# Patient Record
Sex: Female | Born: 1940
Health system: Southern US, Community
[De-identification: ages and names within clinical notes are randomized; demographics above are authoritative.]

## PROBLEM LIST (undated history)

## (undated) DIAGNOSIS — M810 Age-related osteoporosis without current pathological fracture: Secondary | ICD-10-CM

## (undated) DIAGNOSIS — K219 Gastro-esophageal reflux disease without esophagitis: Secondary | ICD-10-CM

## (undated) DIAGNOSIS — E785 Hyperlipidemia, unspecified: Secondary | ICD-10-CM

## (undated) DIAGNOSIS — C801 Malignant (primary) neoplasm, unspecified: Secondary | ICD-10-CM

## (undated) DIAGNOSIS — J449 Chronic obstructive pulmonary disease, unspecified: Secondary | ICD-10-CM

## (undated) DIAGNOSIS — M1990 Unspecified osteoarthritis, unspecified site: Secondary | ICD-10-CM

## (undated) DIAGNOSIS — S22000A Wedge compression fracture of unspecified thoracic vertebra, initial encounter for closed fracture: Secondary | ICD-10-CM

## (undated) DIAGNOSIS — E039 Hypothyroidism, unspecified: Secondary | ICD-10-CM

## (undated) DIAGNOSIS — I10 Essential (primary) hypertension: Secondary | ICD-10-CM

## (undated) HISTORY — DX: Chronic obstructive pulmonary disease, unspecified: J44.9

## (undated) HISTORY — PX: BACK SURGERY: SHX140

## (undated) HISTORY — DX: Wedge compression fracture of unspecified thoracic vertebra, initial encounter for closed fracture: S22.000A

## (undated) HISTORY — DX: Age-related osteoporosis without current pathological fracture: M81.0

## (undated) HISTORY — PX: COLONOSCOPY: SHX174

## (undated) HISTORY — PX: EYE SURGERY: SHX253

---

## 2005-04-14 ENCOUNTER — Ambulatory Visit: Payer: Self-pay | Admitting: Cardiology

## 2005-05-06 ENCOUNTER — Ambulatory Visit: Payer: Self-pay | Admitting: Cardiology

## 2011-08-31 ENCOUNTER — Encounter (HOSPITAL_COMMUNITY): Payer: Self-pay | Admitting: Pharmacy Technician

## 2011-09-02 ENCOUNTER — Encounter (HOSPITAL_COMMUNITY)
Admission: RE | Admit: 2011-09-02 | Discharge: 2011-09-02 | Disposition: A | Discharge status: Home / Self Care | Payer: Medicare Other | Source: Ambulatory Visit | Attending: Neurosurgery | Admitting: Neurosurgery

## 2011-09-02 ENCOUNTER — Encounter (HOSPITAL_COMMUNITY)
Admission: RE | Admit: 2011-09-02 | Discharge: 2011-09-02 | Disposition: A | Discharge status: Home / Self Care | Payer: Medicare Other | Source: Ambulatory Visit | Attending: Anesthesiology | Admitting: Anesthesiology

## 2011-09-02 ENCOUNTER — Encounter (HOSPITAL_COMMUNITY): Payer: Self-pay

## 2011-09-02 DIAGNOSIS — J4489 Other specified chronic obstructive pulmonary disease: Secondary | ICD-10-CM | POA: Insufficient documentation

## 2011-09-02 DIAGNOSIS — R05 Cough: Secondary | ICD-10-CM | POA: Insufficient documentation

## 2011-09-02 DIAGNOSIS — Z01818 Encounter for other preprocedural examination: Secondary | ICD-10-CM | POA: Insufficient documentation

## 2011-09-02 DIAGNOSIS — R059 Cough, unspecified: Secondary | ICD-10-CM | POA: Insufficient documentation

## 2011-09-02 DIAGNOSIS — R0602 Shortness of breath: Secondary | ICD-10-CM | POA: Insufficient documentation

## 2011-09-02 DIAGNOSIS — F17200 Nicotine dependence, unspecified, uncomplicated: Secondary | ICD-10-CM | POA: Insufficient documentation

## 2011-09-02 DIAGNOSIS — J449 Chronic obstructive pulmonary disease, unspecified: Secondary | ICD-10-CM | POA: Insufficient documentation

## 2011-09-02 DIAGNOSIS — M545 Low back pain, unspecified: Secondary | ICD-10-CM | POA: Insufficient documentation

## 2011-09-02 HISTORY — DX: Hyperlipidemia, unspecified: E78.5

## 2011-09-02 HISTORY — DX: Unspecified osteoarthritis, unspecified site: M19.90

## 2011-09-02 LAB — TYPE AND SCREEN
ABO/RH(D): A POS
Antibody Screen: NEGATIVE

## 2011-09-02 LAB — DIFFERENTIAL
Basophils Absolute: 0.1 10*3/uL (ref 0.0–0.1)
Lymphocytes Relative: 36 % (ref 12–46)
Lymphs Abs: 3.7 10*3/uL (ref 0.7–4.0)
Neutro Abs: 5 10*3/uL (ref 1.7–7.7)

## 2011-09-02 LAB — SURGICAL PCR SCREEN
MRSA, PCR: NEGATIVE
Staphylococcus aureus: NEGATIVE

## 2011-09-02 LAB — CBC
Platelets: 291 10*3/uL (ref 150–400)
RBC: 4.93 MIL/uL (ref 3.87–5.11)
RDW: 12.6 % (ref 11.5–15.5)
WBC: 10.4 10*3/uL (ref 4.0–10.5)

## 2011-09-02 MED ORDER — CEFAZOLIN SODIUM 1-5 GM-% IV SOLN: 1.0000 g | INTRAVENOUS | Status: DC

## 2011-09-02 MED ORDER — DEXAMETHASONE SODIUM PHOSPHATE 10 MG/ML IJ SOLN: 10.0000 mg | Freq: Once | INTRAMUSCULAR | Status: DC

## 2011-09-02 NOTE — Pre-Procedure Instructions (Signed)
20 Tonya Murphy  09/02/2011   Your procedure is scheduled on:  09/14/2011  Report to [facility] at 0530 AM.  Call this number if you have problems the morning of surgery: [phone]   Remember:   Do not eat food:After Midnight.  May have clear liquids: up to 4 Hours before arrival.  Clear liquids include soda, tea, black coffee, apple or grape juice, broth.  Take these medicines the morning of surgery with A SIP OF WATER: OMEPRAZOLE   Do not wear jewelry, make-up or nail polish.  Do not wear lotions, powders, or perfumes. You may wear deodorant.  Do not shave 48 hours prior to surgery.  Do not bring valuables to the hospital.  Contacts, dentures or bridgework may not be worn into surgery.  Leave suitcase in the car. After surgery it may be brought to your room.  For patients admitted to the hospital, checkout time is 11:00 AM the day of discharge.   Patients discharged the day of surgery will not be allowed to drive home.  Name and phone number of your driver: family  Special Instructions: CHG Shower Use Special Wash: 1/2 bottle night before surgery and 1/2 bottle morning of surgery.   Please read over the following fact sheets that you were given: Pain Booklet, Coughing and Deep Breathing, Blood Transfusion Information, MRSA Information and Surgical Site Infection Prevention

## 2011-09-14 ENCOUNTER — Encounter (HOSPITAL_COMMUNITY): Admission: RE | Payer: Self-pay | Source: Ambulatory Visit

## 2011-09-14 ENCOUNTER — Inpatient Hospital Stay (HOSPITAL_COMMUNITY): Admission: RE | Admit: 2011-09-14 | Payer: Medicare Other | Source: Ambulatory Visit | Admitting: Neurosurgery

## 2011-09-14 SURGERY — LUMBAR LAMINECTOMY/DECOMPRESSION MICRODISCECTOMY
Anesthesia: General | Site: Back | Laterality: Left

## 2015-12-30 DIAGNOSIS — Z1231 Encounter for screening mammogram for malignant neoplasm of breast: Secondary | ICD-10-CM | POA: Diagnosis not present

## 2016-02-26 DIAGNOSIS — R194 Change in bowel habit: Secondary | ICD-10-CM | POA: Diagnosis not present

## 2016-02-28 DIAGNOSIS — Z8249 Family history of ischemic heart disease and other diseases of the circulatory system: Secondary | ICD-10-CM | POA: Diagnosis not present

## 2016-02-28 DIAGNOSIS — Z79899 Other long term (current) drug therapy: Secondary | ICD-10-CM | POA: Diagnosis not present

## 2016-02-28 DIAGNOSIS — Z7982 Long term (current) use of aspirin: Secondary | ICD-10-CM | POA: Diagnosis not present

## 2016-02-28 DIAGNOSIS — J449 Chronic obstructive pulmonary disease, unspecified: Secondary | ICD-10-CM | POA: Diagnosis not present

## 2016-02-28 DIAGNOSIS — R197 Diarrhea, unspecified: Secondary | ICD-10-CM | POA: Diagnosis not present

## 2016-02-28 DIAGNOSIS — E785 Hyperlipidemia, unspecified: Secondary | ICD-10-CM | POA: Diagnosis not present

## 2016-02-28 DIAGNOSIS — F17210 Nicotine dependence, cigarettes, uncomplicated: Secondary | ICD-10-CM | POA: Diagnosis not present

## 2016-02-28 DIAGNOSIS — R194 Change in bowel habit: Secondary | ICD-10-CM | POA: Diagnosis not present

## 2016-02-28 DIAGNOSIS — R109 Unspecified abdominal pain: Secondary | ICD-10-CM | POA: Diagnosis not present

## 2016-02-28 DIAGNOSIS — Z823 Family history of stroke: Secondary | ICD-10-CM | POA: Diagnosis not present

## 2016-04-17 DIAGNOSIS — I700 Atherosclerosis of aorta: Secondary | ICD-10-CM | POA: Diagnosis not present

## 2016-04-17 DIAGNOSIS — M549 Dorsalgia, unspecified: Secondary | ICD-10-CM | POA: Diagnosis not present

## 2016-04-17 DIAGNOSIS — M47814 Spondylosis without myelopathy or radiculopathy, thoracic region: Secondary | ICD-10-CM | POA: Diagnosis not present

## 2016-04-17 DIAGNOSIS — M546 Pain in thoracic spine: Secondary | ICD-10-CM | POA: Diagnosis not present

## 2016-04-17 DIAGNOSIS — Z299 Encounter for prophylactic measures, unspecified: Secondary | ICD-10-CM | POA: Diagnosis not present

## 2016-04-17 DIAGNOSIS — F17200 Nicotine dependence, unspecified, uncomplicated: Secondary | ICD-10-CM | POA: Diagnosis not present

## 2016-04-17 DIAGNOSIS — M2560 Stiffness of unspecified joint, not elsewhere classified: Secondary | ICD-10-CM | POA: Diagnosis not present

## 2016-04-23 DIAGNOSIS — R0602 Shortness of breath: Secondary | ICD-10-CM | POA: Diagnosis not present

## 2016-04-23 DIAGNOSIS — F17210 Nicotine dependence, cigarettes, uncomplicated: Secondary | ICD-10-CM | POA: Diagnosis not present

## 2016-04-23 DIAGNOSIS — J209 Acute bronchitis, unspecified: Secondary | ICD-10-CM | POA: Diagnosis not present

## 2016-04-23 DIAGNOSIS — I10 Essential (primary) hypertension: Secondary | ICD-10-CM | POA: Diagnosis not present

## 2016-04-23 DIAGNOSIS — M546 Pain in thoracic spine: Secondary | ICD-10-CM | POA: Diagnosis not present

## 2016-04-23 DIAGNOSIS — Z720 Tobacco use: Secondary | ICD-10-CM | POA: Diagnosis not present

## 2016-04-24 DIAGNOSIS — J440 Chronic obstructive pulmonary disease with acute lower respiratory infection: Secondary | ICD-10-CM | POA: Diagnosis not present

## 2016-04-24 DIAGNOSIS — Z883 Allergy status to other anti-infective agents status: Secondary | ICD-10-CM | POA: Diagnosis not present

## 2016-04-24 DIAGNOSIS — E782 Mixed hyperlipidemia: Secondary | ICD-10-CM | POA: Diagnosis present

## 2016-04-24 DIAGNOSIS — I10 Essential (primary) hypertension: Secondary | ICD-10-CM | POA: Diagnosis present

## 2016-04-24 DIAGNOSIS — Z9109 Other allergy status, other than to drugs and biological substances: Secondary | ICD-10-CM | POA: Diagnosis not present

## 2016-04-24 DIAGNOSIS — M8088XA Other osteoporosis with current pathological fracture, vertebra(e), initial encounter for fracture: Secondary | ICD-10-CM | POA: Diagnosis not present

## 2016-04-24 DIAGNOSIS — J40 Bronchitis, not specified as acute or chronic: Secondary | ICD-10-CM | POA: Diagnosis not present

## 2016-04-24 DIAGNOSIS — D72829 Elevated white blood cell count, unspecified: Secondary | ICD-10-CM | POA: Diagnosis present

## 2016-04-24 DIAGNOSIS — J302 Other seasonal allergic rhinitis: Secondary | ICD-10-CM | POA: Diagnosis present

## 2016-04-24 DIAGNOSIS — J449 Chronic obstructive pulmonary disease, unspecified: Secondary | ICD-10-CM | POA: Diagnosis present

## 2016-04-24 DIAGNOSIS — M546 Pain in thoracic spine: Secondary | ICD-10-CM | POA: Diagnosis not present

## 2016-04-24 DIAGNOSIS — J181 Lobar pneumonia, unspecified organism: Secondary | ICD-10-CM | POA: Diagnosis not present

## 2016-04-24 DIAGNOSIS — J42 Unspecified chronic bronchitis: Secondary | ICD-10-CM | POA: Diagnosis present

## 2016-04-24 DIAGNOSIS — S22060A Wedge compression fracture of T7-T8 vertebra, initial encounter for closed fracture: Secondary | ICD-10-CM | POA: Diagnosis not present

## 2016-04-28 DIAGNOSIS — M4854XA Collapsed vertebra, not elsewhere classified, thoracic region, initial encounter for fracture: Secondary | ICD-10-CM | POA: Diagnosis not present

## 2016-04-28 DIAGNOSIS — J449 Chronic obstructive pulmonary disease, unspecified: Secondary | ICD-10-CM | POA: Diagnosis not present

## 2016-04-28 DIAGNOSIS — E785 Hyperlipidemia, unspecified: Secondary | ICD-10-CM | POA: Diagnosis not present

## 2016-04-28 DIAGNOSIS — S32000A Wedge compression fracture of unspecified lumbar vertebra, initial encounter for closed fracture: Secondary | ICD-10-CM | POA: Diagnosis not present

## 2016-04-28 DIAGNOSIS — G43909 Migraine, unspecified, not intractable, without status migrainosus: Secondary | ICD-10-CM | POA: Diagnosis not present

## 2016-04-28 DIAGNOSIS — I10 Essential (primary) hypertension: Secondary | ICD-10-CM | POA: Diagnosis not present

## 2016-04-28 DIAGNOSIS — M8588 Other specified disorders of bone density and structure, other site: Secondary | ICD-10-CM | POA: Diagnosis not present

## 2016-05-07 DIAGNOSIS — M8468XS Pathological fracture in other disease, other site, sequela: Secondary | ICD-10-CM | POA: Diagnosis not present

## 2016-05-07 DIAGNOSIS — Z9889 Other specified postprocedural states: Secondary | ICD-10-CM | POA: Diagnosis not present

## 2016-05-27 DIAGNOSIS — I700 Atherosclerosis of aorta: Secondary | ICD-10-CM | POA: Diagnosis not present

## 2016-05-27 DIAGNOSIS — S22050A Wedge compression fracture of T5-T6 vertebra, initial encounter for closed fracture: Secondary | ICD-10-CM | POA: Diagnosis not present

## 2016-05-27 DIAGNOSIS — M4854XA Collapsed vertebra, not elsewhere classified, thoracic region, initial encounter for fracture: Secondary | ICD-10-CM | POA: Diagnosis not present

## 2016-05-27 DIAGNOSIS — M8588 Other specified disorders of bone density and structure, other site: Secondary | ICD-10-CM | POA: Diagnosis not present

## 2016-06-01 DIAGNOSIS — M4854XA Collapsed vertebra, not elsewhere classified, thoracic region, initial encounter for fracture: Secondary | ICD-10-CM | POA: Diagnosis not present

## 2016-06-01 DIAGNOSIS — M546 Pain in thoracic spine: Secondary | ICD-10-CM | POA: Diagnosis not present

## 2016-06-01 DIAGNOSIS — M4854XD Collapsed vertebra, not elsewhere classified, thoracic region, subsequent encounter for fracture with routine healing: Secondary | ICD-10-CM | POA: Diagnosis not present

## 2016-06-03 DIAGNOSIS — M810 Age-related osteoporosis without current pathological fracture: Secondary | ICD-10-CM | POA: Diagnosis not present

## 2016-06-03 DIAGNOSIS — Z823 Family history of stroke: Secondary | ICD-10-CM | POA: Diagnosis not present

## 2016-06-03 DIAGNOSIS — M545 Low back pain: Secondary | ICD-10-CM | POA: Diagnosis not present

## 2016-06-03 DIAGNOSIS — M069 Rheumatoid arthritis, unspecified: Secondary | ICD-10-CM | POA: Diagnosis not present

## 2016-06-03 DIAGNOSIS — M1990 Unspecified osteoarthritis, unspecified site: Secondary | ICD-10-CM | POA: Diagnosis not present

## 2016-06-03 DIAGNOSIS — M546 Pain in thoracic spine: Secondary | ICD-10-CM | POA: Diagnosis not present

## 2016-06-03 DIAGNOSIS — Z79899 Other long term (current) drug therapy: Secondary | ICD-10-CM | POA: Diagnosis not present

## 2016-06-03 DIAGNOSIS — Z791 Long term (current) use of non-steroidal anti-inflammatories (NSAID): Secondary | ICD-10-CM | POA: Diagnosis not present

## 2016-06-03 DIAGNOSIS — E785 Hyperlipidemia, unspecified: Secondary | ICD-10-CM | POA: Diagnosis not present

## 2016-06-03 DIAGNOSIS — E7800 Pure hypercholesterolemia, unspecified: Secondary | ICD-10-CM | POA: Diagnosis not present

## 2016-06-03 DIAGNOSIS — J449 Chronic obstructive pulmonary disease, unspecified: Secondary | ICD-10-CM | POA: Diagnosis not present

## 2016-06-03 DIAGNOSIS — M4854XA Collapsed vertebra, not elsewhere classified, thoracic region, initial encounter for fracture: Secondary | ICD-10-CM | POA: Diagnosis not present

## 2016-06-03 DIAGNOSIS — F17210 Nicotine dependence, cigarettes, uncomplicated: Secondary | ICD-10-CM | POA: Diagnosis not present

## 2016-06-03 DIAGNOSIS — Z79891 Long term (current) use of opiate analgesic: Secondary | ICD-10-CM | POA: Diagnosis not present

## 2016-06-03 DIAGNOSIS — Z7982 Long term (current) use of aspirin: Secondary | ICD-10-CM | POA: Diagnosis not present

## 2016-06-12 DIAGNOSIS — M4184 Other forms of scoliosis, thoracic region: Secondary | ICD-10-CM | POA: Diagnosis not present

## 2016-06-12 DIAGNOSIS — M4854XA Collapsed vertebra, not elsewhere classified, thoracic region, initial encounter for fracture: Secondary | ICD-10-CM | POA: Diagnosis not present

## 2016-06-12 DIAGNOSIS — Z9889 Other specified postprocedural states: Secondary | ICD-10-CM | POA: Diagnosis not present

## 2016-07-02 DIAGNOSIS — Z23 Encounter for immunization: Secondary | ICD-10-CM | POA: Diagnosis not present

## 2016-07-30 DIAGNOSIS — Z6825 Body mass index (BMI) 25.0-25.9, adult: Secondary | ICD-10-CM | POA: Diagnosis not present

## 2016-07-30 DIAGNOSIS — Z1389 Encounter for screening for other disorder: Secondary | ICD-10-CM | POA: Diagnosis not present

## 2016-07-30 DIAGNOSIS — Z299 Encounter for prophylactic measures, unspecified: Secondary | ICD-10-CM | POA: Diagnosis not present

## 2016-07-30 DIAGNOSIS — Z1211 Encounter for screening for malignant neoplasm of colon: Secondary | ICD-10-CM | POA: Diagnosis not present

## 2016-07-30 DIAGNOSIS — Z0000 Encounter for general adult medical examination without abnormal findings: Secondary | ICD-10-CM | POA: Diagnosis not present

## 2016-07-30 DIAGNOSIS — Z7189 Other specified counseling: Secondary | ICD-10-CM | POA: Diagnosis not present

## 2016-07-31 DIAGNOSIS — R5383 Other fatigue: Secondary | ICD-10-CM | POA: Diagnosis not present

## 2016-07-31 DIAGNOSIS — Z79899 Other long term (current) drug therapy: Secondary | ICD-10-CM | POA: Diagnosis not present

## 2016-07-31 DIAGNOSIS — I10 Essential (primary) hypertension: Secondary | ICD-10-CM | POA: Diagnosis not present

## 2016-08-31 DIAGNOSIS — L02414 Cutaneous abscess of left upper limb: Secondary | ICD-10-CM | POA: Diagnosis not present

## 2016-12-31 DIAGNOSIS — Z1231 Encounter for screening mammogram for malignant neoplasm of breast: Secondary | ICD-10-CM | POA: Diagnosis not present

## 2017-05-24 DIAGNOSIS — E663 Overweight: Secondary | ICD-10-CM | POA: Diagnosis not present

## 2017-05-24 DIAGNOSIS — F17210 Nicotine dependence, cigarettes, uncomplicated: Secondary | ICD-10-CM | POA: Diagnosis not present

## 2017-05-24 DIAGNOSIS — J42 Unspecified chronic bronchitis: Secondary | ICD-10-CM | POA: Diagnosis not present

## 2017-05-24 DIAGNOSIS — J449 Chronic obstructive pulmonary disease, unspecified: Secondary | ICD-10-CM | POA: Diagnosis not present

## 2017-05-24 DIAGNOSIS — Z299 Encounter for prophylactic measures, unspecified: Secondary | ICD-10-CM | POA: Diagnosis not present

## 2017-05-24 DIAGNOSIS — E782 Mixed hyperlipidemia: Secondary | ICD-10-CM | POA: Diagnosis not present

## 2017-05-24 DIAGNOSIS — R197 Diarrhea, unspecified: Secondary | ICD-10-CM | POA: Diagnosis not present

## 2017-07-09 DIAGNOSIS — Z23 Encounter for immunization: Secondary | ICD-10-CM | POA: Diagnosis not present

## 2017-07-12 DIAGNOSIS — R197 Diarrhea, unspecified: Secondary | ICD-10-CM | POA: Diagnosis not present

## 2017-07-16 DIAGNOSIS — R197 Diarrhea, unspecified: Secondary | ICD-10-CM | POA: Diagnosis not present

## 2017-08-04 DIAGNOSIS — R5383 Other fatigue: Secondary | ICD-10-CM | POA: Diagnosis not present

## 2017-08-04 DIAGNOSIS — Z1211 Encounter for screening for malignant neoplasm of colon: Secondary | ICD-10-CM | POA: Diagnosis not present

## 2017-08-04 DIAGNOSIS — E782 Mixed hyperlipidemia: Secondary | ICD-10-CM | POA: Diagnosis not present

## 2017-08-04 DIAGNOSIS — Z01419 Encounter for gynecological examination (general) (routine) without abnormal findings: Secondary | ICD-10-CM | POA: Diagnosis not present

## 2017-08-04 DIAGNOSIS — F419 Anxiety disorder, unspecified: Secondary | ICD-10-CM | POA: Diagnosis not present

## 2017-08-04 DIAGNOSIS — Z299 Encounter for prophylactic measures, unspecified: Secondary | ICD-10-CM | POA: Diagnosis not present

## 2017-08-04 DIAGNOSIS — Z1339 Encounter for screening examination for other mental health and behavioral disorders: Secondary | ICD-10-CM | POA: Diagnosis not present

## 2017-08-04 DIAGNOSIS — Z0000 Encounter for general adult medical examination without abnormal findings: Secondary | ICD-10-CM | POA: Diagnosis not present

## 2017-08-04 DIAGNOSIS — J449 Chronic obstructive pulmonary disease, unspecified: Secondary | ICD-10-CM | POA: Diagnosis not present

## 2017-08-04 DIAGNOSIS — E663 Overweight: Secondary | ICD-10-CM | POA: Diagnosis not present

## 2017-08-04 DIAGNOSIS — Z7189 Other specified counseling: Secondary | ICD-10-CM | POA: Diagnosis not present

## 2017-08-04 DIAGNOSIS — Z1331 Encounter for screening for depression: Secondary | ICD-10-CM | POA: Diagnosis not present

## 2017-08-05 DIAGNOSIS — Z79899 Other long term (current) drug therapy: Secondary | ICD-10-CM | POA: Diagnosis not present

## 2017-08-05 DIAGNOSIS — F419 Anxiety disorder, unspecified: Secondary | ICD-10-CM | POA: Diagnosis not present

## 2017-08-05 DIAGNOSIS — E782 Mixed hyperlipidemia: Secondary | ICD-10-CM | POA: Diagnosis not present

## 2017-08-09 DIAGNOSIS — Z299 Encounter for prophylactic measures, unspecified: Secondary | ICD-10-CM | POA: Diagnosis not present

## 2017-08-09 DIAGNOSIS — D72829 Elevated white blood cell count, unspecified: Secondary | ICD-10-CM | POA: Diagnosis not present

## 2017-08-09 DIAGNOSIS — Z6825 Body mass index (BMI) 25.0-25.9, adult: Secondary | ICD-10-CM | POA: Diagnosis not present

## 2017-08-09 DIAGNOSIS — J441 Chronic obstructive pulmonary disease with (acute) exacerbation: Secondary | ICD-10-CM | POA: Diagnosis not present

## 2017-08-09 DIAGNOSIS — I10 Essential (primary) hypertension: Secondary | ICD-10-CM | POA: Diagnosis not present

## 2017-08-09 DIAGNOSIS — F17210 Nicotine dependence, cigarettes, uncomplicated: Secondary | ICD-10-CM | POA: Diagnosis not present

## 2017-08-11 DIAGNOSIS — Z1283 Encounter for screening for malignant neoplasm of skin: Secondary | ICD-10-CM | POA: Diagnosis not present

## 2017-08-11 DIAGNOSIS — D225 Melanocytic nevi of trunk: Secondary | ICD-10-CM | POA: Diagnosis not present

## 2017-08-11 DIAGNOSIS — C44722 Squamous cell carcinoma of skin of right lower limb, including hip: Secondary | ICD-10-CM | POA: Diagnosis not present

## 2017-08-24 DIAGNOSIS — E782 Mixed hyperlipidemia: Secondary | ICD-10-CM | POA: Diagnosis not present

## 2017-08-24 DIAGNOSIS — I10 Essential (primary) hypertension: Secondary | ICD-10-CM | POA: Diagnosis not present

## 2017-08-24 DIAGNOSIS — Z299 Encounter for prophylactic measures, unspecified: Secondary | ICD-10-CM | POA: Diagnosis not present

## 2017-08-24 DIAGNOSIS — J449 Chronic obstructive pulmonary disease, unspecified: Secondary | ICD-10-CM | POA: Diagnosis not present

## 2017-08-24 DIAGNOSIS — I2510 Atherosclerotic heart disease of native coronary artery without angina pectoris: Secondary | ICD-10-CM | POA: Diagnosis not present

## 2017-08-24 DIAGNOSIS — F329 Major depressive disorder, single episode, unspecified: Secondary | ICD-10-CM | POA: Diagnosis not present

## 2017-08-24 DIAGNOSIS — F17200 Nicotine dependence, unspecified, uncomplicated: Secondary | ICD-10-CM | POA: Diagnosis not present

## 2017-08-24 DIAGNOSIS — D72829 Elevated white blood cell count, unspecified: Secondary | ICD-10-CM | POA: Diagnosis not present

## 2017-08-24 DIAGNOSIS — Z6824 Body mass index (BMI) 24.0-24.9, adult: Secondary | ICD-10-CM | POA: Diagnosis not present

## 2017-09-22 DIAGNOSIS — D72829 Elevated white blood cell count, unspecified: Secondary | ICD-10-CM | POA: Diagnosis not present

## 2017-09-22 DIAGNOSIS — Z85828 Personal history of other malignant neoplasm of skin: Secondary | ICD-10-CM | POA: Diagnosis not present

## 2017-09-22 DIAGNOSIS — L821 Other seborrheic keratosis: Secondary | ICD-10-CM | POA: Diagnosis not present

## 2017-09-22 DIAGNOSIS — Z08 Encounter for follow-up examination after completed treatment for malignant neoplasm: Secondary | ICD-10-CM | POA: Diagnosis not present

## 2017-10-05 HISTORY — PX: BREAST BIOPSY: SHX20

## 2018-01-20 DIAGNOSIS — R928 Other abnormal and inconclusive findings on diagnostic imaging of breast: Secondary | ICD-10-CM | POA: Diagnosis not present

## 2018-01-20 DIAGNOSIS — Z1231 Encounter for screening mammogram for malignant neoplasm of breast: Secondary | ICD-10-CM | POA: Diagnosis not present

## 2018-01-26 DIAGNOSIS — N6321 Unspecified lump in the left breast, upper outer quadrant: Secondary | ICD-10-CM | POA: Diagnosis not present

## 2018-01-26 DIAGNOSIS — R928 Other abnormal and inconclusive findings on diagnostic imaging of breast: Secondary | ICD-10-CM | POA: Diagnosis not present

## 2018-01-31 DIAGNOSIS — M546 Pain in thoracic spine: Secondary | ICD-10-CM | POA: Insufficient documentation

## 2018-01-31 DIAGNOSIS — M4854XA Collapsed vertebra, not elsewhere classified, thoracic region, initial encounter for fracture: Secondary | ICD-10-CM | POA: Diagnosis not present

## 2018-02-01 DIAGNOSIS — S22070A Wedge compression fracture of T9-T10 vertebra, initial encounter for closed fracture: Secondary | ICD-10-CM | POA: Insufficient documentation

## 2018-02-02 DIAGNOSIS — N6321 Unspecified lump in the left breast, upper outer quadrant: Secondary | ICD-10-CM | POA: Diagnosis not present

## 2018-02-02 DIAGNOSIS — R928 Other abnormal and inconclusive findings on diagnostic imaging of breast: Secondary | ICD-10-CM | POA: Diagnosis not present

## 2018-02-02 DIAGNOSIS — N6012 Diffuse cystic mastopathy of left breast: Secondary | ICD-10-CM | POA: Diagnosis not present

## 2018-02-08 DIAGNOSIS — S22070A Wedge compression fracture of T9-T10 vertebra, initial encounter for closed fracture: Secondary | ICD-10-CM | POA: Diagnosis not present

## 2018-02-08 DIAGNOSIS — M546 Pain in thoracic spine: Secondary | ICD-10-CM | POA: Diagnosis not present

## 2018-02-08 DIAGNOSIS — Z981 Arthrodesis status: Secondary | ICD-10-CM | POA: Diagnosis not present

## 2018-02-10 DIAGNOSIS — Z888 Allergy status to other drugs, medicaments and biological substances status: Secondary | ICD-10-CM | POA: Diagnosis not present

## 2018-02-10 DIAGNOSIS — K589 Irritable bowel syndrome without diarrhea: Secondary | ICD-10-CM | POA: Diagnosis not present

## 2018-02-10 DIAGNOSIS — S22070A Wedge compression fracture of T9-T10 vertebra, initial encounter for closed fracture: Secondary | ICD-10-CM | POA: Diagnosis not present

## 2018-02-10 DIAGNOSIS — M810 Age-related osteoporosis without current pathological fracture: Secondary | ICD-10-CM | POA: Diagnosis not present

## 2018-02-10 DIAGNOSIS — Z823 Family history of stroke: Secondary | ICD-10-CM | POA: Diagnosis not present

## 2018-02-10 DIAGNOSIS — M546 Pain in thoracic spine: Secondary | ICD-10-CM | POA: Diagnosis not present

## 2018-02-10 DIAGNOSIS — M1990 Unspecified osteoarthritis, unspecified site: Secondary | ICD-10-CM | POA: Diagnosis not present

## 2018-02-10 DIAGNOSIS — Z7982 Long term (current) use of aspirin: Secondary | ICD-10-CM | POA: Diagnosis not present

## 2018-02-10 DIAGNOSIS — Z79899 Other long term (current) drug therapy: Secondary | ICD-10-CM | POA: Diagnosis not present

## 2018-02-10 DIAGNOSIS — E785 Hyperlipidemia, unspecified: Secondary | ICD-10-CM | POA: Diagnosis not present

## 2018-02-10 DIAGNOSIS — J449 Chronic obstructive pulmonary disease, unspecified: Secondary | ICD-10-CM | POA: Diagnosis not present

## 2018-02-10 DIAGNOSIS — Z886 Allergy status to analgesic agent status: Secondary | ICD-10-CM | POA: Diagnosis not present

## 2018-02-10 DIAGNOSIS — F17210 Nicotine dependence, cigarettes, uncomplicated: Secondary | ICD-10-CM | POA: Diagnosis not present

## 2018-02-11 DIAGNOSIS — Z79899 Other long term (current) drug therapy: Secondary | ICD-10-CM | POA: Diagnosis not present

## 2018-02-11 DIAGNOSIS — E785 Hyperlipidemia, unspecified: Secondary | ICD-10-CM | POA: Diagnosis not present

## 2018-02-11 DIAGNOSIS — S22070A Wedge compression fracture of T9-T10 vertebra, initial encounter for closed fracture: Secondary | ICD-10-CM | POA: Diagnosis not present

## 2018-02-11 DIAGNOSIS — M810 Age-related osteoporosis without current pathological fracture: Secondary | ICD-10-CM | POA: Diagnosis not present

## 2018-02-11 DIAGNOSIS — Z7982 Long term (current) use of aspirin: Secondary | ICD-10-CM | POA: Diagnosis not present

## 2018-02-11 DIAGNOSIS — F17210 Nicotine dependence, cigarettes, uncomplicated: Secondary | ICD-10-CM | POA: Diagnosis not present

## 2018-02-18 DIAGNOSIS — S22070A Wedge compression fracture of T9-T10 vertebra, initial encounter for closed fracture: Secondary | ICD-10-CM | POA: Diagnosis not present

## 2018-02-18 DIAGNOSIS — Z9889 Other specified postprocedural states: Secondary | ICD-10-CM | POA: Diagnosis not present

## 2018-03-16 DIAGNOSIS — Z09 Encounter for follow-up examination after completed treatment for conditions other than malignant neoplasm: Secondary | ICD-10-CM | POA: Diagnosis not present

## 2018-03-16 DIAGNOSIS — S22070A Wedge compression fracture of T9-T10 vertebra, initial encounter for closed fracture: Secondary | ICD-10-CM | POA: Diagnosis not present

## 2018-07-29 DIAGNOSIS — Z23 Encounter for immunization: Secondary | ICD-10-CM | POA: Diagnosis not present

## 2018-08-10 DIAGNOSIS — Z6825 Body mass index (BMI) 25.0-25.9, adult: Secondary | ICD-10-CM | POA: Diagnosis not present

## 2018-08-10 DIAGNOSIS — Z1331 Encounter for screening for depression: Secondary | ICD-10-CM | POA: Diagnosis not present

## 2018-08-10 DIAGNOSIS — I10 Essential (primary) hypertension: Secondary | ICD-10-CM | POA: Diagnosis not present

## 2018-08-10 DIAGNOSIS — Z299 Encounter for prophylactic measures, unspecified: Secondary | ICD-10-CM | POA: Diagnosis not present

## 2018-08-10 DIAGNOSIS — Z1339 Encounter for screening examination for other mental health and behavioral disorders: Secondary | ICD-10-CM | POA: Diagnosis not present

## 2018-08-10 DIAGNOSIS — Z7189 Other specified counseling: Secondary | ICD-10-CM | POA: Diagnosis not present

## 2018-08-10 DIAGNOSIS — E7800 Pure hypercholesterolemia, unspecified: Secondary | ICD-10-CM | POA: Diagnosis not present

## 2018-08-10 DIAGNOSIS — Z1211 Encounter for screening for malignant neoplasm of colon: Secondary | ICD-10-CM | POA: Diagnosis not present

## 2018-08-10 DIAGNOSIS — K529 Noninfective gastroenteritis and colitis, unspecified: Secondary | ICD-10-CM | POA: Diagnosis not present

## 2018-08-10 DIAGNOSIS — Z0000 Encounter for general adult medical examination without abnormal findings: Secondary | ICD-10-CM | POA: Diagnosis not present

## 2018-08-10 DIAGNOSIS — R5383 Other fatigue: Secondary | ICD-10-CM | POA: Diagnosis not present

## 2018-08-11 DIAGNOSIS — R5383 Other fatigue: Secondary | ICD-10-CM | POA: Diagnosis not present

## 2018-08-11 DIAGNOSIS — Z79899 Other long term (current) drug therapy: Secondary | ICD-10-CM | POA: Diagnosis not present

## 2018-08-11 DIAGNOSIS — E7800 Pure hypercholesterolemia, unspecified: Secondary | ICD-10-CM | POA: Diagnosis not present

## 2018-09-07 ENCOUNTER — Telehealth: Payer: Self-pay

## 2018-09-07 ENCOUNTER — Encounter: Payer: Self-pay | Admitting: Gastroenterology

## 2018-09-07 ENCOUNTER — Ambulatory Visit (INDEPENDENT_AMBULATORY_CARE_PROVIDER_SITE_OTHER): Payer: Medicare Other | Admitting: Gastroenterology

## 2018-09-07 DIAGNOSIS — R197 Diarrhea, unspecified: Secondary | ICD-10-CM

## 2018-09-07 MED ORDER — DIPHENOXYLATE-ATROPINE 2.5-0.025 MG PO TABS: ORAL_TABLET | ORAL | 11 refills | Status: DC

## 2018-09-07 NOTE — Assessment & Plan Note (Addendum)
SYMPTOMS NOT IDEALLY CONTROLLED ON DICYCLOMINE. DIFFERENTIAL DIAGNOSIS INCLUDES: PANCREATIC INSUFFICIENCY, LACTOSE INTOLERANCE, LESS LIKELY MICROSCOPIC COLITIS, CELIAC SPRUE, THYROID DISTURBANCE, GIARDIASIS, C DIFF COLITIS, OR IBD.  DRINK WATER TO KEEP YOUR URINE LIGHT YELLOW. FOLLOW A LOW LACTOSE DIET.  HANDOUT GIVEN. PT LOVE CHOCOLATE.  STOP BENTYL. TAKE LOMOTIL AT BEDTIME. YOU CAN TAKE ONE MORE DURING THE DAY IF NEEDED. HOLD THE BEDTIME DOSE IF YOU FEEL CONSTIPATED. IF YOU CONSUME DAIRY, ADD LACTASE 3 PILLS WITH MEALS UP TO THREE TIMES A DAY. PLEASE CALL IN ONE MONTH IF YOUR SYMPTOMS ARE NOT IMPROVED AND WE WILL NEED TO SCHEDULE A COLONOSCOPY TO BIOPSY YOUR COLON.  COMPLETE THE LABS WITHIN 7 DAYS. I AM CHECKING FOR CELIAC SPRUE AND SUBMIT ONE MORE STOOL STUDY. GET LABS/STOOL STUDIES FROM PCP. FOLLOW UP IN 3 MOS.

## 2018-09-07 NOTE — Progress Notes (Signed)
CC'D TO PCP °

## 2018-09-07 NOTE — Telephone Encounter (Signed)
PLEASE CALL PT. SORRY FOR THE CONFUSION. HER RX HAS BEEN SENT.

## 2018-09-07 NOTE — Telephone Encounter (Signed)
PT is aware.

## 2018-09-07 NOTE — Progress Notes (Signed)
Subjective:    Patient ID: Tonya Murphy, female    DOB: Jun 30, 1941, 77 y.o.   MRN: [id number] Tonya Chroman, MD  HPI BEFORE THIS STARTED HAVING #3-4 EVERY DAY. HAD TCS TO HAVE COLONOSCOPY IN 2017. GIVEN MED FOR DIARRHEA AND TOOK ONE A DAY AND TWO A DAY AND STOOLS WERE THE SAME. BMs: #6-7(5-6 IN ONE AM AND THEN NOTHING USUALLY). NOCTURNAL STOOL: COUPLES TIMES A MONTH. ACCIDENTS WHEN SHE COUGHS: 2 IN LAST MONTH. TRIED LOMOTIL FIRST, IT HELPED AND SHE CHANGED TO DICYCLOMINE. TRAVEL: NO. ABX: NO. FAMILY HISTORY: NO DIARRHEA. SOB: COPD, LIGHT(<10/DAY). RRAE HEARTBURN. MILK: LITTLE TO NONE, CHEESE: LOVES IT(<3-4X/WEEK), ICE CREAM: NOT MUCH, CHOCOLATE: DAILY(LITTLE DEBBIE CAKE, OR CANDY BAR, CHOCOLATE CAKE). DID STOOL AND BLOOD TESTS. WELL WATER: NO.  PT DENIES FEVER, CHILLS, HEMATOCHEZIA, HEMATEMESIS, nausea, vomiting, melena, CHEST PAIN, SHORTNESS OF BREATH, constipation, abdominal pain, problems swallowing,OR problems with sedation.  Past Medical History:  Diagnosis Date  . Arthritis   . Compression fracture of body of thoracic vertebra (HCC)   . COPD (chronic obstructive pulmonary disease) (HCC) with exertion  . Hyperlipidemia   . Osteoporosis    Past Surgical History:  Procedure Laterality Date  . BACK SURGERY  2017, 2019   COMPRESSION FRACTURES  . EYE SURGERY  lasik ou   2001  . EYE SURGERY  cataract ou   2010   No Known Allergies   Current Outpatient Medications  Medication Sig    . aspirin EC 81 MG tablet Take 81 mg by mouth daily.      Marland Kitchen BIOTIN PO Take 1 tablet by mouth daily.      Marland Kitchen buPROPion (WELLBUTRIN SR) 150 MG 12 hr tablet Take 150 mg by mouth daily.    . Calcium Carbonate-Vitamin D (CALCIUM + D PO) Take 2-3 tablets by mouth daily.     Marland Kitchen dicyclomine (BENTYL) 10 MG capsule Take 1 capsule by mouth daily.    . Melatonin 10 MG TABS Take by mouth at bedtime.    . simvastatin (ZOCOR) 80 MG tablet Take 80 mg by mouth daily.      . vitamin B-12 50 MCG tablet Take 50 mcg by  mouth as needed.    . calcitonin, salmon, nasal spray Place 1 spray into the nose daily.      .      . omeprazole (PRILOSEC) 20 MG capsule Take 20 mg by mouth daily.       Family History  Problem Relation Age of Onset  . Crohn's disease Grandchild   . Colon cancer Neg Hx   . Colon polyps Neg Hx   . Celiac disease Neg Hx     Social History   Socioeconomic History  . Marital status: Married    Spouse name: Not on file  . Number of children: Not on file  . Years of education: Not on file  . Highest education level: Not on file  Occupational History  . Not on file  Social Needs  . Financial resource strain: Not on file  . Food insecurity:    Worry: Not on file    Inability: Not on file  . Transportation needs:    Medical: Not on file    Non-medical: Not on file  Tobacco Use  . Smoking status: Current Every Day Smoker    Packs/day: 1.00  . Smokeless tobacco: Never Used  Substance and Sexual Activity  . Alcohol use: Yes    Comment: occ  . Drug use:  No  . Sexual activity: Not on file  Lifestyle  . Physical activity:    Days per week: Not on file    Minutes per session: Not on file  . Stress: Not on file  Relationships  . Social connections:    Talks on phone: Not on file    Gets together: Not on file    Attends religious service: Not on file    Active member of club or organization: Not on file    Attends meetings of clubs or organizations: Not on file    Relationship status: Not on file  Other Topics Concern  . 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   DOES INCOME TAX ON THE SIDE.   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 IN [place]   HOBBIES: READS, CLEANING HER HOUSE   Review of Systems PER HPI OTHERWISE ALL SYSTEMS ARE NEGATIVE.    Objective:   Physical Exam  Constitutional: She is oriented to person, place, and time. She appears well-developed and well-nourished. No distress.  HENT:  Head: Normocephalic and  atraumatic.  Mouth/Throat: Oropharynx is clear and moist. No oropharyngeal exudate.  Eyes: Pupils are equal, round, and reactive to light. No scleral icterus.  Neck: Normal range of motion. Neck supple.  Cardiovascular: Normal rate, regular rhythm and normal heart sounds.  Pulmonary/Chest: Effort normal and breath sounds normal. No respiratory distress.  Abdominal: Soft. Bowel sounds are normal. She exhibits no distension. There is no tenderness.  Musculoskeletal: She exhibits no edema.  WALKS in 1-2 in heels  Lymphadenopathy:    She has no cervical adenopathy.  Neurological: She is alert and oriented to person, place, and time.  NO FOCAL DEFICITS  Psychiatric: She has a normal mood and affect.  Vitals reviewed.     Assessment & Plan:

## 2018-09-07 NOTE — Progress Notes (Signed)
ON RECALL  °

## 2018-09-07 NOTE — Patient Instructions (Addendum)
DRINK WATER TO KEEP YOUR URINE LIGHT YELLOW.  FOLLOW A LOW LACTOSE DIET. SEE INFO BELOW.  IF YOU CONSUME DAIRY, ADD LACTASE 3 PILLS WITH MEALS UP TO THREE TIMES A DAY.   STOP BENTYL(DICYCLOMINE). TAKE LOMOTIL AT BEDTIME. YOU CAN TAKE ONE MORE DURING THE DAY IF NEEDED. HOLD THE BEDTIME DOSE IF YOU FEEL CONSTIPATED.   PLEASE CALL IN ONE MONTH IF YOUR SYMPTOMS ARE NOT IMPROVED AND WE WILL NEED TO SCHEDULE A COLONOSCOPY TO BIOPSY YOUR COLON.   COMPLETE THE LABS WITHIN 7 DAYS. I AM CHECKING FOR CELIAC SPRUE AND SUBMIT ONE MORE STOOL STUDY.   FOLLOW UP IN 3 MOS.    Lactose Free Diet Lactose is a carbohydrate that is found mainly in milk and milk products, as well as in foods with added milk or whey. Lactose must be digested by the enzyme in order to be used by the body. Lactose intolerance occurs when there is a shortage of lactase. When your body is not able to digest lactose, you may feel sick to your stomach (nausea), bloating, cramping, gas and diarrhea.  There are many dairy products that may be tolerated better than milk by some people:  The use of cultured dairy products such as yogurt, buttermilk, cottage cheese, and sweet acidophilus milk (Kefir) for lactase-deficient individuals is usually well tolerated. This is because the healthy bacteria help digest lactose.   Lactose-hydrolyzed milk (Lactaid) contains 40-90% less lactose than milk and may also be well tolerated.    SPECIAL NOTES  Lactose is a carbohydrates. The major food source is dairy products. Reading food labels is important. Many products contain lactose even when they are not made from milk. Look for the following words: whey, milk solids, dry milk solids, nonfat dry milk powder. Typical sources of lactose other than dairy products include breads, candies, cold cuts, prepared and processed foods, and commercial sauces and gravies.   All foods must be prepared without milk, cream, or other dairy foods.   Soy milk and  lactose-free supplements (LACTASE) may be used as an alternative to milk.   FOOD GROUP ALLOWED/RECOMMENDED AVOID/USE SPARINGLY  BREADS / STARCHES 4 servings or more* Breads and rolls made without milk. [place], [place], or [place] bread. Breads and rolls that contain milk. Prepared mixes such as muffins, biscuits, waffles, pancakes. Sweet rolls, donuts, [place] toast (if made with milk or lactose).  Crackers: Soda crackers, graham crackers. Any crackers prepared without lactose. Zwieback crackers, corn curls, or any that contain lactose.  Cereals: Cooked or dry cereals prepared without lactose (read labels). Cooked or dry cereals prepared with lactose (read labels). Total, Cocoa Krispies. Special K.  Potatoes / Pasta / Rice: Any prepared without milk or lactose. Popcorn. Instant potatoes, frozen [place] fries, scalloped or au gratin potatoes.  VEGETABLES 2 servings or more Fresh, frozen, and canned vegetables. Creamed or breaded vegetables. Vegetables in a cheese sauce or with lactose-containing margarines.  FRUIT 2 servings or more All fresh, canned, or frozen fruits that are not processed with lactose. Any canned or frozen fruits processed with lactose.  MEAT & SUBSTITUTES 2 servings or more (4 to 6 oz. total per day) Plain beef, chicken, fish, [place], lamb, veal, pork, or ham. Kosher prepared meat products. Strained or junior meats that do not contain milk. Eggs, soy meat substitutes, nuts. Scrambled eggs, omelets, and souffles that contain milk. Creamed or breaded meat, fish, or fowl. Sausage products such as wieners, liver sausage, or cold cuts that contain milk solids. Cheese, cottage  cheese, or cheese spreads.  MILK None. (See "BEVERAGES" for milk substitutes. See "DESSERTS" for ice cream and frozen desserts.) Milk (whole, 2%, skim, or chocolate). Evaporated, powdered, or condensed milk; malted milk.  SOUPS & COMBINATION FOODS Bouillon, broth, vegetable soups, clear soups, consomms. Homemade  soups made with allowed ingredients. Combination or prepared foods that do not contain milk or milk products (read labels). Cream soups, chowders, commercially prepared soups containing lactose. Macaroni and cheese, pizza. Combination or prepared foods that contain milk or milk products.  DESSERTS & SWEETS In moderation Water and fruit ices; gelatin; angel food cake. Homemade cookies, pies, or cakes made from allowed ingredients. Pudding (if made with water or a milk substitute). Lactose-free tofu desserts. Sugar, honey, corn syrup, jam, jelly; marmalade; molasses (beet sugar); Pure sugar candy; marshmallows. Ice cream, ice milk, sherbet, custard, pudding, frozen yogurt. Commercial cake and cookie mixes. Desserts that contain chocolate. Pie crust made with milk-containing margarine; reduced-calorie desserts made with a sugar substitute that contains lactose. Toffee, peppermint, butterscotch, chocolate, caramels.  FATS & OILS In moderation Butter (as tolerated; contains very small amounts of lactose). Margarines and dressings that do not contain milk, Vegetable oils, shortening, Miracle Whip, mayonnaise, nondairy cream & whipped toppings without lactose or milk solids added (examples: Coffee Rich, Carnation Coffeemate, Rich's Whipped Topping, PolyRich). Berniece Salines. Margarines and salad dressings containing milk; cream, cream cheese; peanut butter with added milk solids, sour cream, chip dips, made with sour cream.  BEVERAGES Carbonated drinks; tea; coffee and freeze-dried coffee; some instant coffees (check labels). Fruit drinks; fruit and vegetable juice; Rice or Soy milk. Ovaltine, hot chocolate. Some cocoas; some instant coffees; instant iced teas; powdered fruit drinks (read labels).   CONDIMENTS / MISCELLANEOUS Soy sauce, carob powder, olives, gravy made with water, baker's cocoa, pickles, pure seasonings and spices, wine, pure monosodium glutamate, catsup, mustard. Some chewing gums, chocolate, some  cocoas. Certain antibiotics and vitamin / mineral preparations. Spice blends if they contain milk products. MSG extender. Artificial sweeteners that contain lactose such as Equal (Nutra-Sweet) and Sweet 'n Low. Some nondairy creamers (read labels).   SAMPLE MENU*  Breakfast   Orange Juice.  Banana.   Bran flakes.   Nondairy Creamer.  [place] Bread (toasted).   Butter or milk-free margarine.   Coffee or tea.    Noon Meal   Chicken Breast.  Rice.   Green beans.   Butter or milk-free margarine.  Fresh melon.   Coffee or tea.    Evening Meal   Roast Beef.  Baked potato.   Butter or milk-free margarine.   Broccoli.   Lettuce salad with vinegar and oil dressing.  [employer]   Coffee or tea.

## 2018-09-07 NOTE — Telephone Encounter (Signed)
Pt said the pharmacy did not have her prescription when she got there.  Dr. Oneida Alar, please advise!

## 2018-09-09 ENCOUNTER — Encounter: Payer: Self-pay | Admitting: Gastroenterology

## 2018-09-09 DIAGNOSIS — R197 Diarrhea, unspecified: Secondary | ICD-10-CM | POA: Diagnosis not present

## 2018-09-14 DIAGNOSIS — L570 Actinic keratosis: Secondary | ICD-10-CM | POA: Diagnosis not present

## 2018-09-14 DIAGNOSIS — Z85828 Personal history of other malignant neoplasm of skin: Secondary | ICD-10-CM | POA: Diagnosis not present

## 2018-09-14 DIAGNOSIS — Z08 Encounter for follow-up examination after completed treatment for malignant neoplasm: Secondary | ICD-10-CM | POA: Diagnosis not present

## 2018-09-14 DIAGNOSIS — L708 Other acne: Secondary | ICD-10-CM | POA: Diagnosis not present

## 2018-09-14 DIAGNOSIS — X32XXXD Exposure to sunlight, subsequent encounter: Secondary | ICD-10-CM | POA: Diagnosis not present

## 2018-09-20 ENCOUNTER — Telehealth: Payer: Self-pay | Admitting: Gastroenterology

## 2018-09-20 NOTE — Telephone Encounter (Signed)
Pt said she was here on 09/07/2018 and did her labs at [facility] on 09/09/2018. Manuela Schwartz, please see if you can get results of celiac sprue labs and stool studies.

## 2018-09-20 NOTE — Telephone Encounter (Signed)
[phone] PATIENT CALLED INQUIRING RESULTS FROM THE TEST SHE HAD

## 2018-09-21 NOTE — Telephone Encounter (Signed)
requested

## 2018-09-22 NOTE — Telephone Encounter (Signed)
PT called for results and I found them in Dr. Nona Dell office, just received on 09/21/2018. I have informed pt of negative results for celiac sprue and also the giardia. Paperwork on Dr. Nona Dell chair to sign off on.

## 2018-09-27 NOTE — Telephone Encounter (Signed)
PLEASE CALL PT. HER C DIFF/GIARDIA TEST ARE NEGATIVE.SHE DOES NOT HAVE CELIAC SPRUE.   DRINK WATER TO KEEP YOUR URINE LIGHT YELLOW.  FOLLOW A LOW LACTOSE DIET. IF YOU CONSUME DAIRY, ADD LACTASE 3 PILLS WITH MEALS UP TO THREE TIMES A DAY.   TAKE LOMOTIL AT BEDTIME. YOU CAN TAKE ONE MORE DURING THE DAY IF NEEDED. HOLD THE BEDTIME DOSE IF YOU FEEL CONSTIPATED.  PLEASE CALL IN ONE MONTH IF YOUR SYMPTOMS ARE NOT IMPROVED AND WE WILL NEED TO SCHEDULE A COLONOSCOPY TO BIOPSY YOUR COLON.   FOLLOW UP IN 3 MOS.

## 2018-09-30 NOTE — Telephone Encounter (Signed)
Pt notified of results. Pt will continue to drink water, use Lactase pills if consuming dairy, take Lomotil and call back in 1 month if not improved.   Please schedule 3 month f/u

## 2018-10-03 NOTE — Telephone Encounter (Signed)
ON RECALL  °

## 2018-10-25 DIAGNOSIS — M47816 Spondylosis without myelopathy or radiculopathy, lumbar region: Secondary | ICD-10-CM | POA: Diagnosis not present

## 2018-10-25 DIAGNOSIS — S22070A Wedge compression fracture of T9-T10 vertebra, initial encounter for closed fracture: Secondary | ICD-10-CM | POA: Diagnosis not present

## 2018-10-25 DIAGNOSIS — M4186 Other forms of scoliosis, lumbar region: Secondary | ICD-10-CM | POA: Diagnosis not present

## 2018-10-25 DIAGNOSIS — M48061 Spinal stenosis, lumbar region without neurogenic claudication: Secondary | ICD-10-CM | POA: Diagnosis not present

## 2018-10-27 ENCOUNTER — Encounter: Payer: Self-pay | Admitting: Gastroenterology

## 2018-11-28 DIAGNOSIS — I714 Abdominal aortic aneurysm, without rupture: Secondary | ICD-10-CM | POA: Diagnosis not present

## 2018-11-28 DIAGNOSIS — I700 Atherosclerosis of aorta: Secondary | ICD-10-CM | POA: Diagnosis not present

## 2018-12-02 DIAGNOSIS — I2510 Atherosclerotic heart disease of native coronary artery without angina pectoris: Secondary | ICD-10-CM | POA: Diagnosis not present

## 2018-12-02 DIAGNOSIS — I10 Essential (primary) hypertension: Secondary | ICD-10-CM | POA: Diagnosis not present

## 2018-12-02 DIAGNOSIS — Z6825 Body mass index (BMI) 25.0-25.9, adult: Secondary | ICD-10-CM | POA: Diagnosis not present

## 2018-12-02 DIAGNOSIS — J449 Chronic obstructive pulmonary disease, unspecified: Secondary | ICD-10-CM | POA: Diagnosis not present

## 2018-12-02 DIAGNOSIS — Z299 Encounter for prophylactic measures, unspecified: Secondary | ICD-10-CM | POA: Diagnosis not present

## 2018-12-02 DIAGNOSIS — F17210 Nicotine dependence, cigarettes, uncomplicated: Secondary | ICD-10-CM | POA: Diagnosis not present

## 2018-12-02 DIAGNOSIS — I714 Abdominal aortic aneurysm, without rupture: Secondary | ICD-10-CM | POA: Diagnosis not present

## 2018-12-16 DIAGNOSIS — I2510 Atherosclerotic heart disease of native coronary artery without angina pectoris: Secondary | ICD-10-CM | POA: Diagnosis not present

## 2018-12-16 DIAGNOSIS — Z299 Encounter for prophylactic measures, unspecified: Secondary | ICD-10-CM | POA: Diagnosis not present

## 2018-12-16 DIAGNOSIS — Z6825 Body mass index (BMI) 25.0-25.9, adult: Secondary | ICD-10-CM | POA: Diagnosis not present

## 2018-12-16 DIAGNOSIS — J449 Chronic obstructive pulmonary disease, unspecified: Secondary | ICD-10-CM | POA: Diagnosis not present

## 2018-12-16 DIAGNOSIS — I10 Essential (primary) hypertension: Secondary | ICD-10-CM | POA: Diagnosis not present

## 2019-01-17 DIAGNOSIS — J441 Chronic obstructive pulmonary disease with (acute) exacerbation: Secondary | ICD-10-CM | POA: Diagnosis not present

## 2019-01-17 DIAGNOSIS — I10 Essential (primary) hypertension: Secondary | ICD-10-CM | POA: Diagnosis not present

## 2019-01-17 DIAGNOSIS — Z299 Encounter for prophylactic measures, unspecified: Secondary | ICD-10-CM | POA: Diagnosis not present

## 2019-02-16 DIAGNOSIS — I10 Essential (primary) hypertension: Secondary | ICD-10-CM | POA: Diagnosis not present

## 2019-02-16 DIAGNOSIS — Z6826 Body mass index (BMI) 26.0-26.9, adult: Secondary | ICD-10-CM | POA: Diagnosis not present

## 2019-02-16 DIAGNOSIS — L259 Unspecified contact dermatitis, unspecified cause: Secondary | ICD-10-CM | POA: Diagnosis not present

## 2019-02-16 DIAGNOSIS — Z299 Encounter for prophylactic measures, unspecified: Secondary | ICD-10-CM | POA: Diagnosis not present

## 2019-02-16 DIAGNOSIS — J441 Chronic obstructive pulmonary disease with (acute) exacerbation: Secondary | ICD-10-CM | POA: Diagnosis not present

## 2019-02-16 DIAGNOSIS — L03119 Cellulitis of unspecified part of limb: Secondary | ICD-10-CM | POA: Diagnosis not present

## 2019-02-16 DIAGNOSIS — K529 Noninfective gastroenteritis and colitis, unspecified: Secondary | ICD-10-CM | POA: Diagnosis not present

## 2019-02-23 DIAGNOSIS — Z6825 Body mass index (BMI) 25.0-25.9, adult: Secondary | ICD-10-CM | POA: Diagnosis not present

## 2019-02-23 DIAGNOSIS — J449 Chronic obstructive pulmonary disease, unspecified: Secondary | ICD-10-CM | POA: Diagnosis not present

## 2019-02-23 DIAGNOSIS — L0390 Cellulitis, unspecified: Secondary | ICD-10-CM | POA: Diagnosis not present

## 2019-02-23 DIAGNOSIS — I10 Essential (primary) hypertension: Secondary | ICD-10-CM | POA: Diagnosis not present

## 2019-02-23 DIAGNOSIS — Z299 Encounter for prophylactic measures, unspecified: Secondary | ICD-10-CM | POA: Diagnosis not present

## 2019-07-28 DIAGNOSIS — Z23 Encounter for immunization: Secondary | ICD-10-CM | POA: Diagnosis not present

## 2019-08-15 DIAGNOSIS — Z6826 Body mass index (BMI) 26.0-26.9, adult: Secondary | ICD-10-CM | POA: Diagnosis not present

## 2019-08-15 DIAGNOSIS — I10 Essential (primary) hypertension: Secondary | ICD-10-CM | POA: Diagnosis not present

## 2019-08-15 DIAGNOSIS — Z1339 Encounter for screening examination for other mental health and behavioral disorders: Secondary | ICD-10-CM | POA: Diagnosis not present

## 2019-08-15 DIAGNOSIS — K529 Noninfective gastroenteritis and colitis, unspecified: Secondary | ICD-10-CM | POA: Diagnosis not present

## 2019-08-15 DIAGNOSIS — R5383 Other fatigue: Secondary | ICD-10-CM | POA: Diagnosis not present

## 2019-08-15 DIAGNOSIS — Z7189 Other specified counseling: Secondary | ICD-10-CM | POA: Diagnosis not present

## 2019-08-15 DIAGNOSIS — Z299 Encounter for prophylactic measures, unspecified: Secondary | ICD-10-CM | POA: Diagnosis not present

## 2019-08-15 DIAGNOSIS — Z1331 Encounter for screening for depression: Secondary | ICD-10-CM | POA: Diagnosis not present

## 2019-08-15 DIAGNOSIS — Z0000 Encounter for general adult medical examination without abnormal findings: Secondary | ICD-10-CM | POA: Diagnosis not present

## 2019-08-15 DIAGNOSIS — F419 Anxiety disorder, unspecified: Secondary | ICD-10-CM | POA: Diagnosis not present

## 2019-08-15 DIAGNOSIS — Z1211 Encounter for screening for malignant neoplasm of colon: Secondary | ICD-10-CM | POA: Diagnosis not present

## 2019-08-15 DIAGNOSIS — F17210 Nicotine dependence, cigarettes, uncomplicated: Secondary | ICD-10-CM | POA: Diagnosis not present

## 2019-08-16 DIAGNOSIS — R5383 Other fatigue: Secondary | ICD-10-CM | POA: Diagnosis not present

## 2019-08-16 DIAGNOSIS — F419 Anxiety disorder, unspecified: Secondary | ICD-10-CM | POA: Diagnosis not present

## 2019-08-16 DIAGNOSIS — E782 Mixed hyperlipidemia: Secondary | ICD-10-CM | POA: Diagnosis not present

## 2019-08-16 DIAGNOSIS — Z79899 Other long term (current) drug therapy: Secondary | ICD-10-CM | POA: Diagnosis not present

## 2019-10-09 DIAGNOSIS — I10 Essential (primary) hypertension: Secondary | ICD-10-CM | POA: Diagnosis not present

## 2019-10-09 DIAGNOSIS — Z6827 Body mass index (BMI) 27.0-27.9, adult: Secondary | ICD-10-CM | POA: Diagnosis not present

## 2019-10-09 DIAGNOSIS — J40 Bronchitis, not specified as acute or chronic: Secondary | ICD-10-CM | POA: Diagnosis not present

## 2019-10-09 DIAGNOSIS — Z299 Encounter for prophylactic measures, unspecified: Secondary | ICD-10-CM | POA: Diagnosis not present

## 2019-10-09 DIAGNOSIS — J441 Chronic obstructive pulmonary disease with (acute) exacerbation: Secondary | ICD-10-CM | POA: Diagnosis not present

## 2019-12-18 DIAGNOSIS — I714 Abdominal aortic aneurysm, without rupture: Secondary | ICD-10-CM | POA: Diagnosis not present

## 2019-12-28 DIAGNOSIS — I714 Abdominal aortic aneurysm, without rupture: Secondary | ICD-10-CM | POA: Diagnosis not present

## 2019-12-28 DIAGNOSIS — F17210 Nicotine dependence, cigarettes, uncomplicated: Secondary | ICD-10-CM | POA: Diagnosis not present

## 2019-12-28 DIAGNOSIS — I10 Essential (primary) hypertension: Secondary | ICD-10-CM | POA: Diagnosis not present

## 2019-12-28 DIAGNOSIS — J441 Chronic obstructive pulmonary disease with (acute) exacerbation: Secondary | ICD-10-CM | POA: Diagnosis not present

## 2019-12-28 DIAGNOSIS — H8110 Benign paroxysmal vertigo, unspecified ear: Secondary | ICD-10-CM | POA: Diagnosis not present

## 2019-12-28 DIAGNOSIS — Z299 Encounter for prophylactic measures, unspecified: Secondary | ICD-10-CM | POA: Diagnosis not present

## 2020-01-23 DIAGNOSIS — I10 Essential (primary) hypertension: Secondary | ICD-10-CM | POA: Diagnosis not present

## 2020-01-23 DIAGNOSIS — J449 Chronic obstructive pulmonary disease, unspecified: Secondary | ICD-10-CM | POA: Diagnosis not present

## 2020-01-23 DIAGNOSIS — R42 Dizziness and giddiness: Secondary | ICD-10-CM | POA: Diagnosis not present

## 2020-01-23 DIAGNOSIS — F419 Anxiety disorder, unspecified: Secondary | ICD-10-CM | POA: Diagnosis not present

## 2020-01-23 DIAGNOSIS — Z299 Encounter for prophylactic measures, unspecified: Secondary | ICD-10-CM | POA: Diagnosis not present

## 2020-01-23 DIAGNOSIS — F17210 Nicotine dependence, cigarettes, uncomplicated: Secondary | ICD-10-CM | POA: Diagnosis not present

## 2020-01-31 DIAGNOSIS — I639 Cerebral infarction, unspecified: Secondary | ICD-10-CM | POA: Diagnosis not present

## 2020-01-31 DIAGNOSIS — R42 Dizziness and giddiness: Secondary | ICD-10-CM | POA: Diagnosis not present

## 2020-02-21 DIAGNOSIS — H8112 Benign paroxysmal vertigo, left ear: Secondary | ICD-10-CM | POA: Diagnosis not present

## 2020-02-21 DIAGNOSIS — H903 Sensorineural hearing loss, bilateral: Secondary | ICD-10-CM | POA: Diagnosis not present

## 2020-02-21 DIAGNOSIS — H905 Unspecified sensorineural hearing loss: Secondary | ICD-10-CM

## 2020-02-21 HISTORY — DX: Unspecified sensorineural hearing loss: H90.5

## 2020-02-22 DIAGNOSIS — H903 Sensorineural hearing loss, bilateral: Secondary | ICD-10-CM | POA: Diagnosis not present

## 2020-02-22 DIAGNOSIS — H8112 Benign paroxysmal vertigo, left ear: Secondary | ICD-10-CM | POA: Diagnosis not present

## 2020-04-18 DIAGNOSIS — H8112 Benign paroxysmal vertigo, left ear: Secondary | ICD-10-CM | POA: Diagnosis not present

## 2020-07-08 DIAGNOSIS — I714 Abdominal aortic aneurysm, without rupture: Secondary | ICD-10-CM | POA: Diagnosis not present

## 2020-08-03 DIAGNOSIS — Z23 Encounter for immunization: Secondary | ICD-10-CM | POA: Diagnosis not present

## 2020-08-20 DIAGNOSIS — I10 Essential (primary) hypertension: Secondary | ICD-10-CM | POA: Diagnosis not present

## 2020-08-20 DIAGNOSIS — F17210 Nicotine dependence, cigarettes, uncomplicated: Secondary | ICD-10-CM | POA: Diagnosis not present

## 2020-08-20 DIAGNOSIS — E7800 Pure hypercholesterolemia, unspecified: Secondary | ICD-10-CM | POA: Diagnosis not present

## 2020-08-20 DIAGNOSIS — Z299 Encounter for prophylactic measures, unspecified: Secondary | ICD-10-CM | POA: Diagnosis not present

## 2020-08-20 DIAGNOSIS — Z1339 Encounter for screening examination for other mental health and behavioral disorders: Secondary | ICD-10-CM | POA: Diagnosis not present

## 2020-08-20 DIAGNOSIS — Z0000 Encounter for general adult medical examination without abnormal findings: Secondary | ICD-10-CM | POA: Diagnosis not present

## 2020-08-20 DIAGNOSIS — R5383 Other fatigue: Secondary | ICD-10-CM | POA: Diagnosis not present

## 2020-08-20 DIAGNOSIS — Z79899 Other long term (current) drug therapy: Secondary | ICD-10-CM | POA: Diagnosis not present

## 2020-08-20 DIAGNOSIS — Z6827 Body mass index (BMI) 27.0-27.9, adult: Secondary | ICD-10-CM | POA: Diagnosis not present

## 2020-08-20 DIAGNOSIS — Z1331 Encounter for screening for depression: Secondary | ICD-10-CM | POA: Diagnosis not present

## 2020-08-20 DIAGNOSIS — Z7189 Other specified counseling: Secondary | ICD-10-CM | POA: Diagnosis not present

## 2020-08-21 DIAGNOSIS — E7800 Pure hypercholesterolemia, unspecified: Secondary | ICD-10-CM | POA: Diagnosis not present

## 2020-08-21 DIAGNOSIS — R5383 Other fatigue: Secondary | ICD-10-CM | POA: Diagnosis not present

## 2020-08-21 DIAGNOSIS — Z79899 Other long term (current) drug therapy: Secondary | ICD-10-CM | POA: Diagnosis not present

## 2020-09-02 DIAGNOSIS — Z23 Encounter for immunization: Secondary | ICD-10-CM | POA: Diagnosis not present

## 2020-09-24 DIAGNOSIS — J441 Chronic obstructive pulmonary disease with (acute) exacerbation: Secondary | ICD-10-CM | POA: Diagnosis not present

## 2020-09-24 DIAGNOSIS — Z299 Encounter for prophylactic measures, unspecified: Secondary | ICD-10-CM | POA: Diagnosis not present

## 2020-09-24 DIAGNOSIS — F17210 Nicotine dependence, cigarettes, uncomplicated: Secondary | ICD-10-CM | POA: Diagnosis not present

## 2020-09-24 DIAGNOSIS — I10 Essential (primary) hypertension: Secondary | ICD-10-CM | POA: Diagnosis not present

## 2020-09-24 DIAGNOSIS — I714 Abdominal aortic aneurysm, without rupture: Secondary | ICD-10-CM | POA: Diagnosis not present

## 2020-11-18 ENCOUNTER — Other Ambulatory Visit (HOSPITAL_COMMUNITY): Payer: Self-pay | Admitting: Internal Medicine

## 2020-11-18 DIAGNOSIS — Z1231 Encounter for screening mammogram for malignant neoplasm of breast: Secondary | ICD-10-CM

## 2021-01-24 ENCOUNTER — Ambulatory Visit (HOSPITAL_COMMUNITY)
Admission: RE | Admit: 2021-01-24 | Discharge: 2021-01-24 | Disposition: A | Discharge status: Home / Self Care | Payer: Medicare HMO | Source: Ambulatory Visit | Attending: Internal Medicine | Admitting: Internal Medicine

## 2021-01-24 ENCOUNTER — Encounter (HOSPITAL_COMMUNITY): Payer: Self-pay

## 2021-01-24 ENCOUNTER — Other Ambulatory Visit: Payer: Self-pay

## 2021-01-24 DIAGNOSIS — Z1231 Encounter for screening mammogram for malignant neoplasm of breast: Secondary | ICD-10-CM | POA: Insufficient documentation

## 2021-02-03 DIAGNOSIS — I700 Atherosclerosis of aorta: Secondary | ICD-10-CM | POA: Diagnosis not present

## 2021-02-03 DIAGNOSIS — I713 Abdominal aortic aneurysm, ruptured: Secondary | ICD-10-CM | POA: Diagnosis not present

## 2021-03-14 DIAGNOSIS — I10 Essential (primary) hypertension: Secondary | ICD-10-CM | POA: Diagnosis not present

## 2021-03-14 DIAGNOSIS — J441 Chronic obstructive pulmonary disease with (acute) exacerbation: Secondary | ICD-10-CM | POA: Diagnosis not present

## 2021-03-14 DIAGNOSIS — J309 Allergic rhinitis, unspecified: Secondary | ICD-10-CM | POA: Diagnosis not present

## 2021-03-14 DIAGNOSIS — F17210 Nicotine dependence, cigarettes, uncomplicated: Secondary | ICD-10-CM | POA: Diagnosis not present

## 2021-03-14 DIAGNOSIS — Z299 Encounter for prophylactic measures, unspecified: Secondary | ICD-10-CM | POA: Diagnosis not present

## 2021-03-14 DIAGNOSIS — L03115 Cellulitis of right lower limb: Secondary | ICD-10-CM | POA: Diagnosis not present

## 2021-03-19 DIAGNOSIS — J441 Chronic obstructive pulmonary disease with (acute) exacerbation: Secondary | ICD-10-CM | POA: Diagnosis not present

## 2021-03-19 DIAGNOSIS — L03115 Cellulitis of right lower limb: Secondary | ICD-10-CM | POA: Diagnosis not present

## 2021-03-19 DIAGNOSIS — F17210 Nicotine dependence, cigarettes, uncomplicated: Secondary | ICD-10-CM | POA: Diagnosis not present

## 2021-03-19 DIAGNOSIS — Z299 Encounter for prophylactic measures, unspecified: Secondary | ICD-10-CM | POA: Diagnosis not present

## 2021-03-19 DIAGNOSIS — I10 Essential (primary) hypertension: Secondary | ICD-10-CM | POA: Diagnosis not present

## 2021-03-26 DIAGNOSIS — J449 Chronic obstructive pulmonary disease, unspecified: Secondary | ICD-10-CM | POA: Diagnosis not present

## 2021-03-26 DIAGNOSIS — F17210 Nicotine dependence, cigarettes, uncomplicated: Secondary | ICD-10-CM | POA: Diagnosis not present

## 2021-03-26 DIAGNOSIS — I10 Essential (primary) hypertension: Secondary | ICD-10-CM | POA: Diagnosis not present

## 2021-03-26 DIAGNOSIS — Z299 Encounter for prophylactic measures, unspecified: Secondary | ICD-10-CM | POA: Diagnosis not present

## 2021-03-26 DIAGNOSIS — F331 Major depressive disorder, recurrent, moderate: Secondary | ICD-10-CM | POA: Diagnosis not present

## 2021-03-26 DIAGNOSIS — L03115 Cellulitis of right lower limb: Secondary | ICD-10-CM | POA: Diagnosis not present

## 2021-03-26 DIAGNOSIS — I714 Abdominal aortic aneurysm, without rupture: Secondary | ICD-10-CM | POA: Diagnosis not present

## 2021-08-26 DIAGNOSIS — F419 Anxiety disorder, unspecified: Secondary | ICD-10-CM | POA: Diagnosis not present

## 2021-08-26 DIAGNOSIS — Z6827 Body mass index (BMI) 27.0-27.9, adult: Secondary | ICD-10-CM | POA: Diagnosis not present

## 2021-08-26 DIAGNOSIS — Z299 Encounter for prophylactic measures, unspecified: Secondary | ICD-10-CM | POA: Diagnosis not present

## 2021-08-26 DIAGNOSIS — R5383 Other fatigue: Secondary | ICD-10-CM | POA: Diagnosis not present

## 2021-08-26 DIAGNOSIS — Z1339 Encounter for screening examination for other mental health and behavioral disorders: Secondary | ICD-10-CM | POA: Diagnosis not present

## 2021-08-26 DIAGNOSIS — Z1331 Encounter for screening for depression: Secondary | ICD-10-CM | POA: Diagnosis not present

## 2021-08-26 DIAGNOSIS — Z7189 Other specified counseling: Secondary | ICD-10-CM | POA: Diagnosis not present

## 2021-08-26 DIAGNOSIS — I10 Essential (primary) hypertension: Secondary | ICD-10-CM | POA: Diagnosis not present

## 2021-08-26 DIAGNOSIS — Z0000 Encounter for general adult medical examination without abnormal findings: Secondary | ICD-10-CM | POA: Diagnosis not present

## 2021-08-27 DIAGNOSIS — F419 Anxiety disorder, unspecified: Secondary | ICD-10-CM | POA: Diagnosis not present

## 2021-08-27 DIAGNOSIS — E7800 Pure hypercholesterolemia, unspecified: Secondary | ICD-10-CM | POA: Diagnosis not present

## 2021-08-27 DIAGNOSIS — Z79899 Other long term (current) drug therapy: Secondary | ICD-10-CM | POA: Diagnosis not present

## 2021-08-27 DIAGNOSIS — R5383 Other fatigue: Secondary | ICD-10-CM | POA: Diagnosis not present

## 2021-09-22 DIAGNOSIS — Z299 Encounter for prophylactic measures, unspecified: Secondary | ICD-10-CM | POA: Diagnosis not present

## 2021-09-22 DIAGNOSIS — R29898 Other symptoms and signs involving the musculoskeletal system: Secondary | ICD-10-CM | POA: Diagnosis not present

## 2021-09-22 DIAGNOSIS — R42 Dizziness and giddiness: Secondary | ICD-10-CM | POA: Diagnosis not present

## 2021-09-22 DIAGNOSIS — J3489 Other specified disorders of nose and nasal sinuses: Secondary | ICD-10-CM | POA: Diagnosis not present

## 2021-09-22 DIAGNOSIS — I10 Essential (primary) hypertension: Secondary | ICD-10-CM | POA: Diagnosis not present

## 2021-09-22 DIAGNOSIS — I6782 Cerebral ischemia: Secondary | ICD-10-CM | POA: Diagnosis not present

## 2021-09-22 DIAGNOSIS — I672 Cerebral atherosclerosis: Secondary | ICD-10-CM | POA: Diagnosis not present

## 2021-09-22 DIAGNOSIS — Z6827 Body mass index (BMI) 27.0-27.9, adult: Secondary | ICD-10-CM | POA: Diagnosis not present

## 2021-09-22 DIAGNOSIS — R279 Unspecified lack of coordination: Secondary | ICD-10-CM | POA: Diagnosis not present

## 2021-09-30 DIAGNOSIS — Z6827 Body mass index (BMI) 27.0-27.9, adult: Secondary | ICD-10-CM | POA: Diagnosis not present

## 2021-09-30 DIAGNOSIS — R5383 Other fatigue: Secondary | ICD-10-CM | POA: Diagnosis not present

## 2021-09-30 DIAGNOSIS — J40 Bronchitis, not specified as acute or chronic: Secondary | ICD-10-CM | POA: Diagnosis not present

## 2021-09-30 DIAGNOSIS — Z299 Encounter for prophylactic measures, unspecified: Secondary | ICD-10-CM | POA: Diagnosis not present

## 2021-09-30 DIAGNOSIS — J069 Acute upper respiratory infection, unspecified: Secondary | ICD-10-CM | POA: Diagnosis not present

## 2021-10-08 DIAGNOSIS — R0981 Nasal congestion: Secondary | ICD-10-CM | POA: Diagnosis not present

## 2021-10-08 DIAGNOSIS — F331 Major depressive disorder, recurrent, moderate: Secondary | ICD-10-CM | POA: Diagnosis not present

## 2021-10-08 DIAGNOSIS — H6980 Other specified disorders of Eustachian tube, unspecified ear: Secondary | ICD-10-CM | POA: Diagnosis not present

## 2021-10-08 DIAGNOSIS — J441 Chronic obstructive pulmonary disease with (acute) exacerbation: Secondary | ICD-10-CM | POA: Diagnosis not present

## 2021-10-08 DIAGNOSIS — Z299 Encounter for prophylactic measures, unspecified: Secondary | ICD-10-CM | POA: Diagnosis not present

## 2021-10-15 DIAGNOSIS — J449 Chronic obstructive pulmonary disease, unspecified: Secondary | ICD-10-CM | POA: Diagnosis not present

## 2021-10-15 DIAGNOSIS — Z299 Encounter for prophylactic measures, unspecified: Secondary | ICD-10-CM | POA: Diagnosis not present

## 2021-10-15 DIAGNOSIS — F17210 Nicotine dependence, cigarettes, uncomplicated: Secondary | ICD-10-CM | POA: Diagnosis not present

## 2021-10-15 DIAGNOSIS — I739 Peripheral vascular disease, unspecified: Secondary | ICD-10-CM | POA: Diagnosis not present

## 2021-10-15 DIAGNOSIS — H6980 Other specified disorders of Eustachian tube, unspecified ear: Secondary | ICD-10-CM | POA: Diagnosis not present

## 2021-10-15 DIAGNOSIS — F331 Major depressive disorder, recurrent, moderate: Secondary | ICD-10-CM | POA: Diagnosis not present

## 2021-10-15 DIAGNOSIS — I10 Essential (primary) hypertension: Secondary | ICD-10-CM | POA: Diagnosis not present

## 2021-11-10 DIAGNOSIS — K117 Disturbances of salivary secretion: Secondary | ICD-10-CM | POA: Insufficient documentation

## 2021-11-10 DIAGNOSIS — H903 Sensorineural hearing loss, bilateral: Secondary | ICD-10-CM | POA: Diagnosis not present

## 2021-11-10 DIAGNOSIS — H6122 Impacted cerumen, left ear: Secondary | ICD-10-CM | POA: Insufficient documentation

## 2021-11-10 DIAGNOSIS — H6983 Other specified disorders of Eustachian tube, bilateral: Secondary | ICD-10-CM | POA: Diagnosis not present

## 2021-11-10 DIAGNOSIS — H6993 Unspecified Eustachian tube disorder, bilateral: Secondary | ICD-10-CM | POA: Insufficient documentation

## 2021-12-04 DIAGNOSIS — E039 Hypothyroidism, unspecified: Secondary | ICD-10-CM | POA: Diagnosis not present

## 2021-12-16 ENCOUNTER — Other Ambulatory Visit (HOSPITAL_COMMUNITY): Payer: Self-pay | Admitting: Internal Medicine

## 2021-12-16 DIAGNOSIS — Z1231 Encounter for screening mammogram for malignant neoplasm of breast: Secondary | ICD-10-CM

## 2021-12-25 DIAGNOSIS — Z299 Encounter for prophylactic measures, unspecified: Secondary | ICD-10-CM | POA: Diagnosis not present

## 2021-12-25 DIAGNOSIS — I10 Essential (primary) hypertension: Secondary | ICD-10-CM | POA: Diagnosis not present

## 2021-12-25 DIAGNOSIS — Z6827 Body mass index (BMI) 27.0-27.9, adult: Secondary | ICD-10-CM | POA: Diagnosis not present

## 2021-12-25 DIAGNOSIS — F17210 Nicotine dependence, cigarettes, uncomplicated: Secondary | ICD-10-CM | POA: Diagnosis not present

## 2022-01-06 DIAGNOSIS — L648 Other androgenic alopecia: Secondary | ICD-10-CM | POA: Diagnosis not present

## 2022-01-06 DIAGNOSIS — C44722 Squamous cell carcinoma of skin of right lower limb, including hip: Secondary | ICD-10-CM | POA: Diagnosis not present

## 2022-01-08 DIAGNOSIS — F17210 Nicotine dependence, cigarettes, uncomplicated: Secondary | ICD-10-CM | POA: Diagnosis not present

## 2022-01-08 DIAGNOSIS — Z299 Encounter for prophylactic measures, unspecified: Secondary | ICD-10-CM | POA: Diagnosis not present

## 2022-01-08 DIAGNOSIS — I10 Essential (primary) hypertension: Secondary | ICD-10-CM | POA: Diagnosis not present

## 2022-01-12 DIAGNOSIS — I10 Essential (primary) hypertension: Secondary | ICD-10-CM | POA: Diagnosis not present

## 2022-01-12 DIAGNOSIS — J449 Chronic obstructive pulmonary disease, unspecified: Secondary | ICD-10-CM | POA: Diagnosis not present

## 2022-01-12 DIAGNOSIS — Z299 Encounter for prophylactic measures, unspecified: Secondary | ICD-10-CM | POA: Diagnosis not present

## 2022-01-12 DIAGNOSIS — I739 Peripheral vascular disease, unspecified: Secondary | ICD-10-CM | POA: Diagnosis not present

## 2022-01-28 ENCOUNTER — Ambulatory Visit (HOSPITAL_COMMUNITY)
Admission: RE | Admit: 2022-01-28 | Discharge: 2022-01-28 | Disposition: A | Discharge status: Home / Self Care | Payer: Medicare HMO | Source: Ambulatory Visit | Attending: Internal Medicine | Admitting: Internal Medicine

## 2022-01-28 DIAGNOSIS — Z1231 Encounter for screening mammogram for malignant neoplasm of breast: Secondary | ICD-10-CM | POA: Diagnosis not present

## 2022-02-01 DIAGNOSIS — I10 Essential (primary) hypertension: Secondary | ICD-10-CM | POA: Diagnosis not present

## 2022-02-11 DIAGNOSIS — I10 Essential (primary) hypertension: Secondary | ICD-10-CM | POA: Diagnosis not present

## 2022-02-11 DIAGNOSIS — F17210 Nicotine dependence, cigarettes, uncomplicated: Secondary | ICD-10-CM | POA: Diagnosis not present

## 2022-02-11 DIAGNOSIS — Z299 Encounter for prophylactic measures, unspecified: Secondary | ICD-10-CM | POA: Diagnosis not present

## 2022-02-26 DIAGNOSIS — I714 Abdominal aortic aneurysm, without rupture, unspecified: Secondary | ICD-10-CM

## 2022-02-26 DIAGNOSIS — Z0389 Encounter for observation for other suspected diseases and conditions ruled out: Secondary | ICD-10-CM | POA: Diagnosis not present

## 2022-02-26 HISTORY — DX: Abdominal aortic aneurysm, without rupture, unspecified: I71.40

## 2022-04-16 DIAGNOSIS — L650 Telogen effluvium: Secondary | ICD-10-CM | POA: Diagnosis not present

## 2022-04-16 DIAGNOSIS — L648 Other androgenic alopecia: Secondary | ICD-10-CM | POA: Diagnosis not present

## 2022-04-16 DIAGNOSIS — C44722 Squamous cell carcinoma of skin of right lower limb, including hip: Secondary | ICD-10-CM | POA: Diagnosis not present

## 2022-06-10 IMAGING — MG MM DIGITAL SCREENING BILAT W/ TOMO AND CAD
8 series · 8 of 24 positions shown · non-contrast
Comparison: Previous exam(s).

CLINICAL DATA: Screening.

EXAM:
DIGITAL SCREENING BILATERAL MAMMOGRAM WITH TOMOSYNTHESIS AND CAD
TECHNIQUE: Bilateral screening digital craniocaudal and mediolateral oblique
mammograms were obtained. Bilateral screening digital breast
tomosynthesis was performed. The images were evaluated with
computer-aided detection.

[L MLO synth-2D]
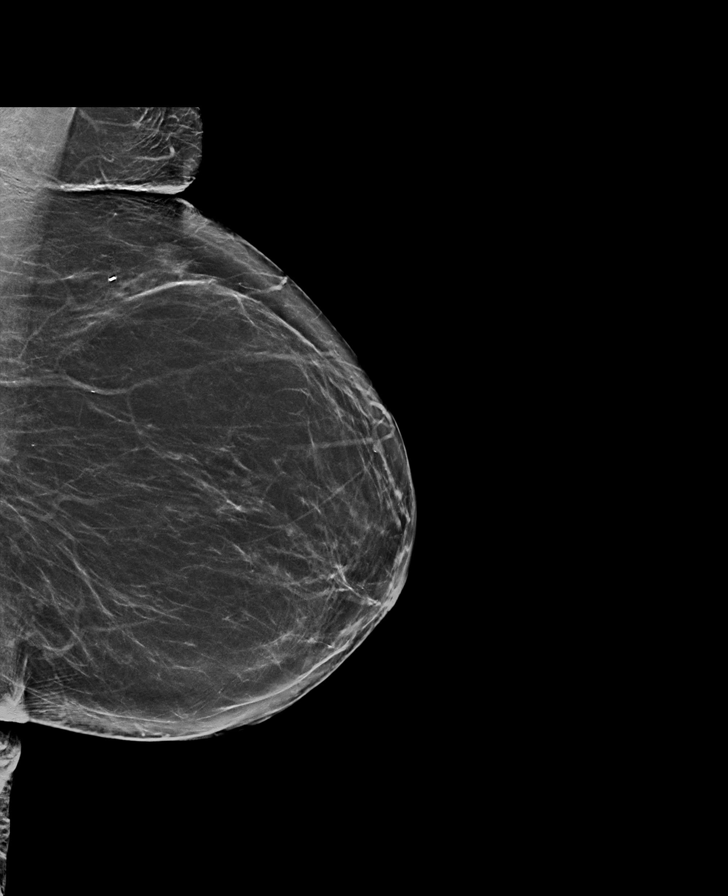

[R CC synth-2D]
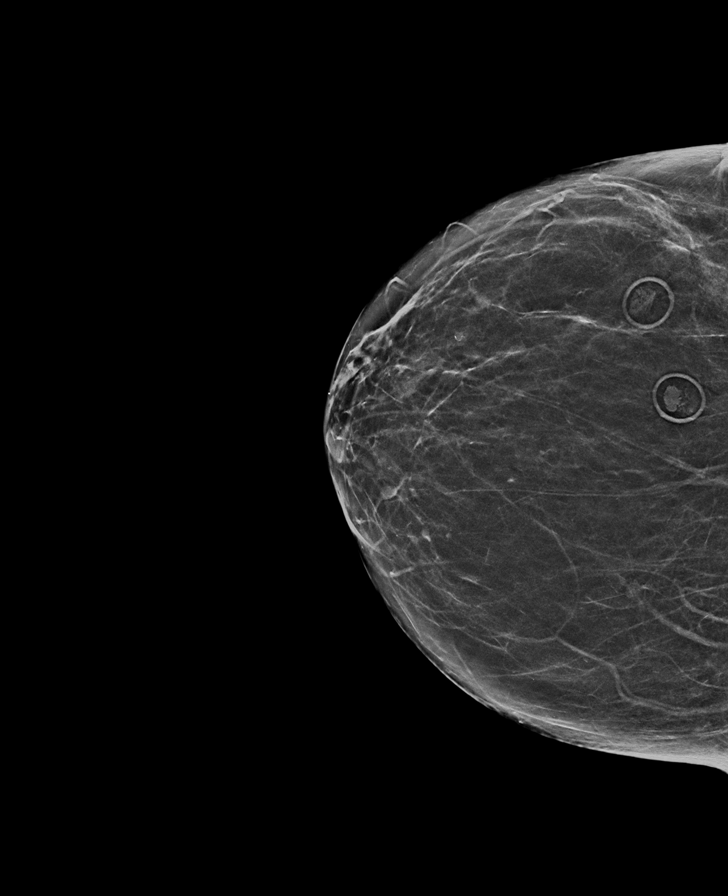

[L CC synth-2D]
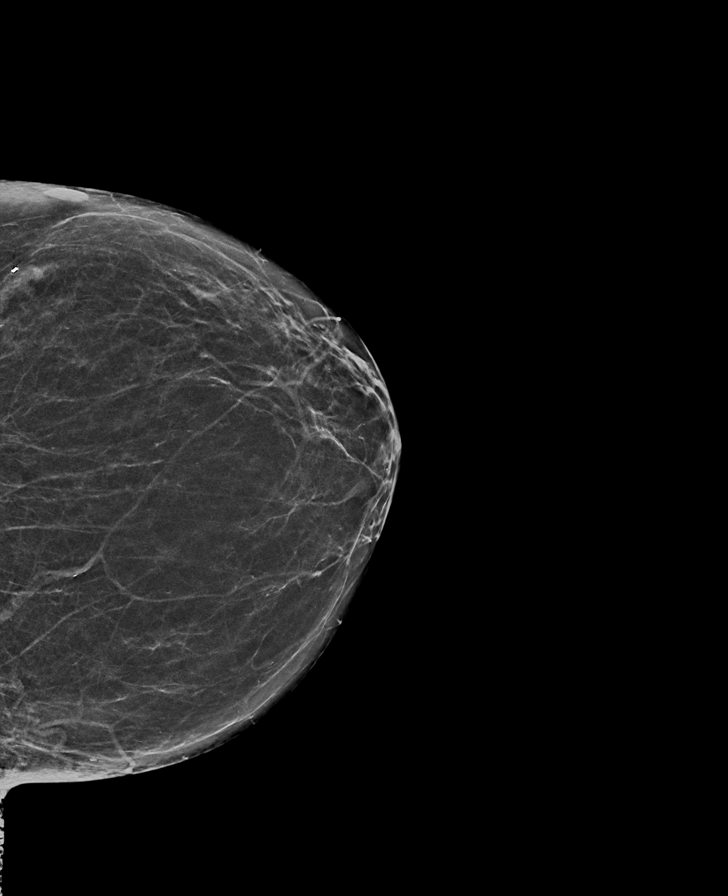

[R MLO synth-2D]
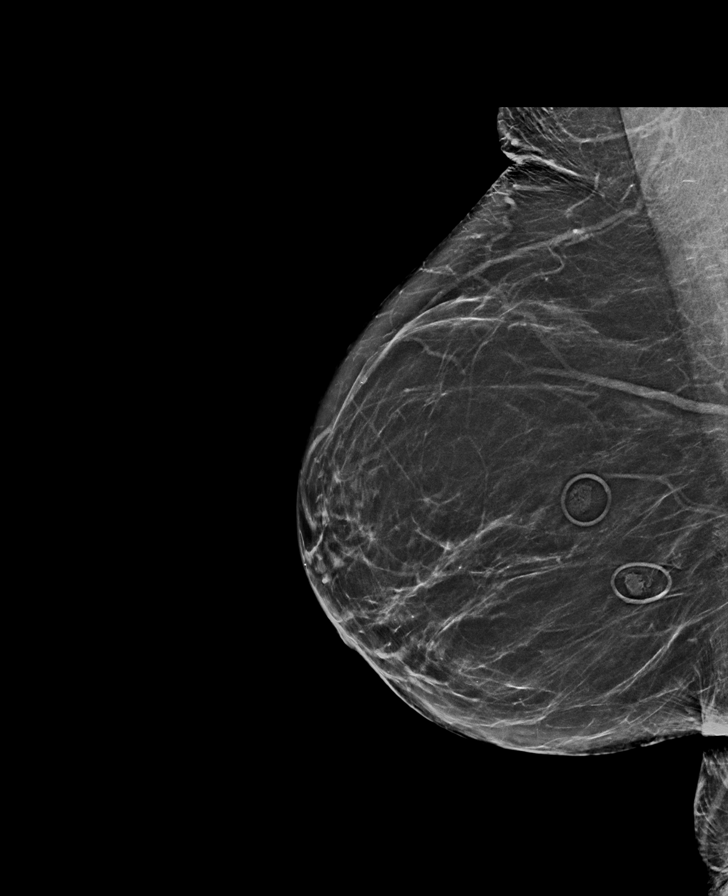

[R MLO tomo · tomo slice 32/63.0]
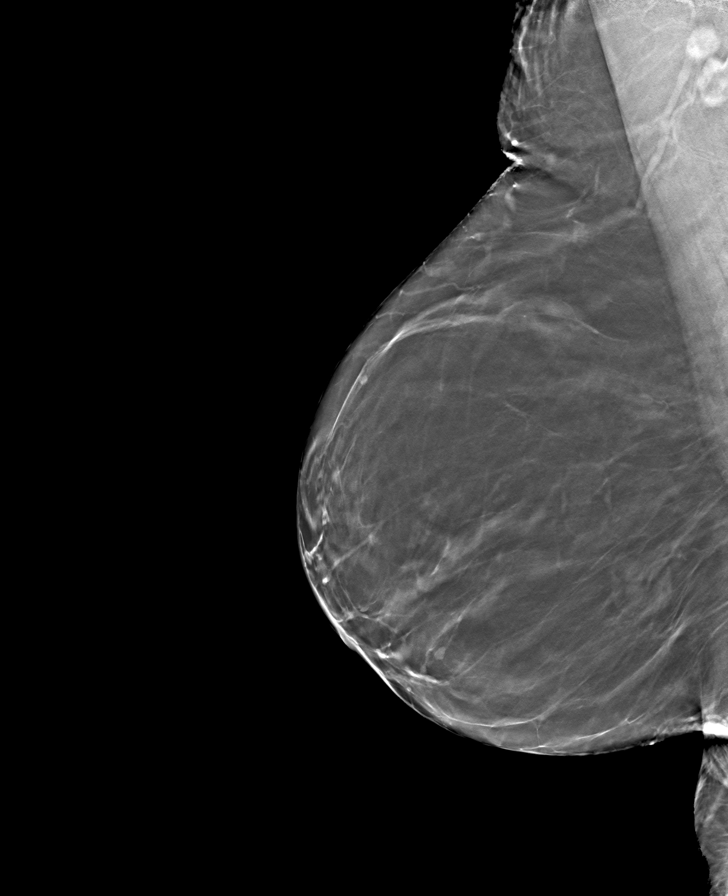

[L CC tomo · tomo slice 29/56.0]
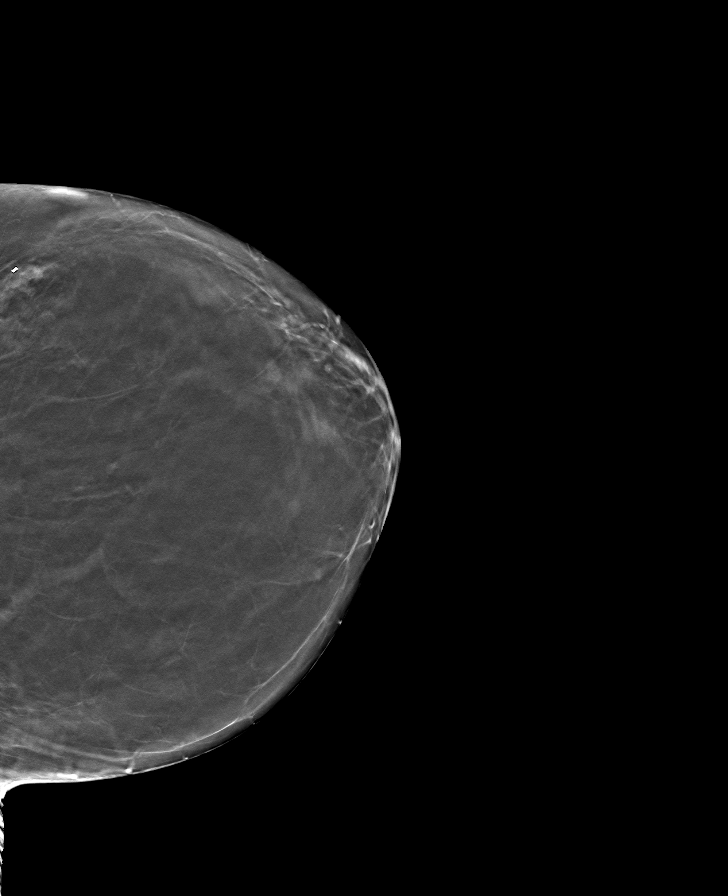

[L MLO tomo · tomo slice 33/66.0]
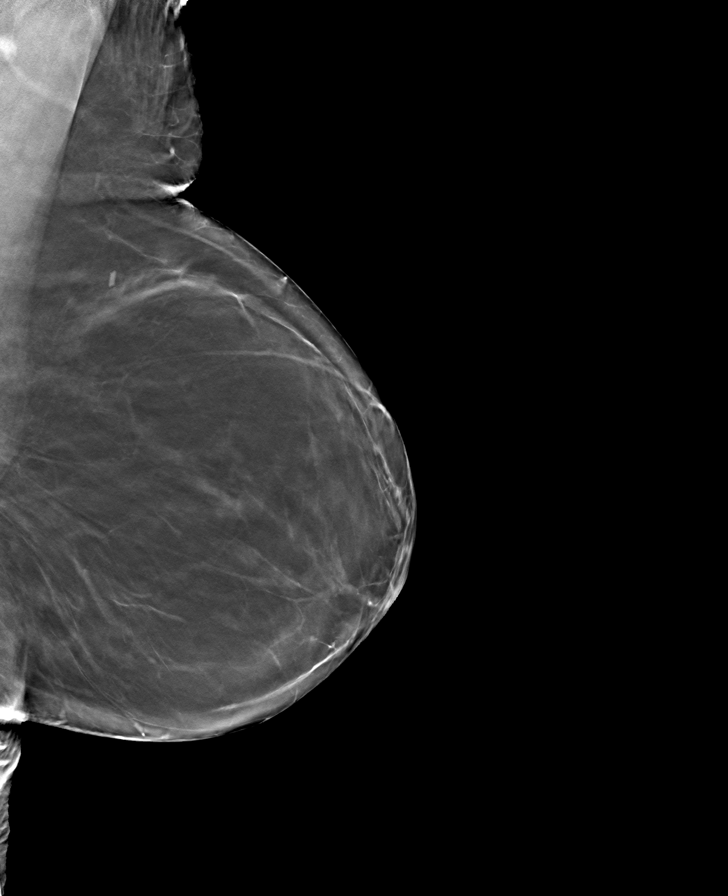

[R CC tomo · tomo slice 29/58.0]
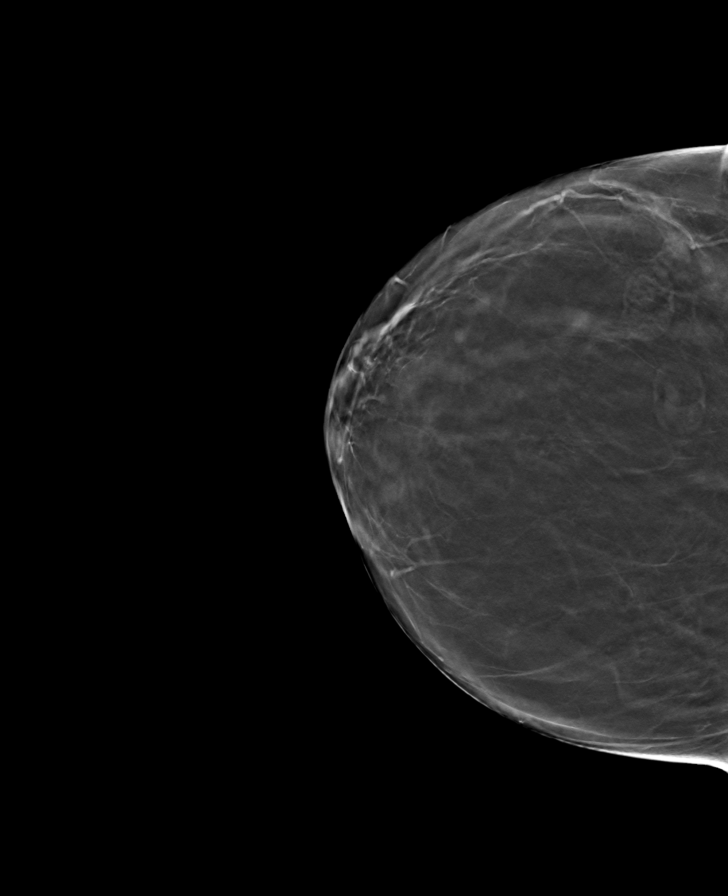

[8 of 24 positions shown; findings below may reference images not displayed]

ACR Breast Density Category b: There are scattered areas of
fibroglandular density.
FINDINGS: There are no findings suspicious for malignancy. The images were
evaluated with computer-aided detection.
IMPRESSION: No mammographic evidence of malignancy. A result letter of this
screening mammogram will be mailed directly to the patient.

RECOMMENDATION:
Screening mammogram in one year. (Code:[id number])

BI-RADS CATEGORY  1: Negative.

## 2022-07-28 DIAGNOSIS — J441 Chronic obstructive pulmonary disease with (acute) exacerbation: Secondary | ICD-10-CM | POA: Diagnosis not present

## 2022-07-28 DIAGNOSIS — F17210 Nicotine dependence, cigarettes, uncomplicated: Secondary | ICD-10-CM | POA: Diagnosis not present

## 2022-07-28 DIAGNOSIS — F331 Major depressive disorder, recurrent, moderate: Secondary | ICD-10-CM | POA: Diagnosis not present

## 2022-07-28 DIAGNOSIS — I739 Peripheral vascular disease, unspecified: Secondary | ICD-10-CM | POA: Diagnosis not present

## 2022-09-01 DIAGNOSIS — I10 Essential (primary) hypertension: Secondary | ICD-10-CM | POA: Diagnosis not present

## 2022-09-01 DIAGNOSIS — R5383 Other fatigue: Secondary | ICD-10-CM | POA: Diagnosis not present

## 2022-09-01 DIAGNOSIS — Z7189 Other specified counseling: Secondary | ICD-10-CM | POA: Diagnosis not present

## 2022-09-01 DIAGNOSIS — Z6827 Body mass index (BMI) 27.0-27.9, adult: Secondary | ICD-10-CM | POA: Diagnosis not present

## 2022-09-01 DIAGNOSIS — F17210 Nicotine dependence, cigarettes, uncomplicated: Secondary | ICD-10-CM | POA: Diagnosis not present

## 2022-09-01 DIAGNOSIS — J441 Chronic obstructive pulmonary disease with (acute) exacerbation: Secondary | ICD-10-CM | POA: Diagnosis not present

## 2022-09-01 DIAGNOSIS — F331 Major depressive disorder, recurrent, moderate: Secondary | ICD-10-CM | POA: Diagnosis not present

## 2022-09-01 DIAGNOSIS — Z1331 Encounter for screening for depression: Secondary | ICD-10-CM | POA: Diagnosis not present

## 2022-09-01 DIAGNOSIS — M25552 Pain in left hip: Secondary | ICD-10-CM | POA: Diagnosis not present

## 2022-09-01 DIAGNOSIS — Z299 Encounter for prophylactic measures, unspecified: Secondary | ICD-10-CM | POA: Diagnosis not present

## 2022-09-01 DIAGNOSIS — Z1339 Encounter for screening examination for other mental health and behavioral disorders: Secondary | ICD-10-CM | POA: Diagnosis not present

## 2022-09-01 DIAGNOSIS — Z0000 Encounter for general adult medical examination without abnormal findings: Secondary | ICD-10-CM | POA: Diagnosis not present

## 2022-09-02 DIAGNOSIS — R5383 Other fatigue: Secondary | ICD-10-CM | POA: Diagnosis not present

## 2022-09-02 DIAGNOSIS — E7800 Pure hypercholesterolemia, unspecified: Secondary | ICD-10-CM | POA: Diagnosis not present

## 2022-09-02 DIAGNOSIS — Z79899 Other long term (current) drug therapy: Secondary | ICD-10-CM | POA: Diagnosis not present

## 2022-09-02 DIAGNOSIS — E039 Hypothyroidism, unspecified: Secondary | ICD-10-CM | POA: Diagnosis not present

## 2022-09-24 DIAGNOSIS — M25552 Pain in left hip: Secondary | ICD-10-CM | POA: Diagnosis not present

## 2022-09-24 DIAGNOSIS — I10 Essential (primary) hypertension: Secondary | ICD-10-CM | POA: Diagnosis not present

## 2022-09-24 DIAGNOSIS — Z299 Encounter for prophylactic measures, unspecified: Secondary | ICD-10-CM | POA: Diagnosis not present

## 2022-09-24 DIAGNOSIS — F17210 Nicotine dependence, cigarettes, uncomplicated: Secondary | ICD-10-CM | POA: Diagnosis not present

## 2022-09-25 DIAGNOSIS — M25552 Pain in left hip: Secondary | ICD-10-CM | POA: Diagnosis not present

## 2022-09-25 DIAGNOSIS — M160 Bilateral primary osteoarthritis of hip: Secondary | ICD-10-CM | POA: Diagnosis not present

## 2022-10-06 ENCOUNTER — Encounter: Payer: Self-pay | Admitting: Orthopaedic Surgery

## 2022-10-06 ENCOUNTER — Ambulatory Visit (INDEPENDENT_AMBULATORY_CARE_PROVIDER_SITE_OTHER): Payer: Medicare HMO | Admitting: Orthopaedic Surgery

## 2022-10-06 VITALS — BP 138/73 | HR 69 | Ht 60.0 in | Wt 136.0 lb

## 2022-10-06 DIAGNOSIS — M1612 Unilateral primary osteoarthritis, left hip: Secondary | ICD-10-CM | POA: Diagnosis not present

## 2022-10-06 DIAGNOSIS — G8929 Other chronic pain: Secondary | ICD-10-CM

## 2022-10-06 NOTE — Patient Instructions (Addendum)
 Take Tylenol or Advil for pain. Due to your condition, these medications will not completely relieve the pain, but they may help ease it.   As the weather changes and gets cooler, you may notice you are affected more. You may have more pain in your joints. This is normal. Dress warmly and make sure that area is covered well.   You need a hip replacement. This is a major surgery. When you are ready to have the surgery, call us and we will refer you to see Dr.Christopher Ninfa Linden at our sister office in [place]. They are called  [facility]  Dr.Keeling is here all day on Tuesdays. He is here half a day on Wednesday mornings, and Thursday mornings. If you need anything such as a medication refill, please either call BEFORE the end of the day on [facility] or send a message through [place]. Your pharmacy can send a refill request for you. Calling by the end of the day on [facility] allows [place] time to send Dr.Keeling the request and for him to respond before he leaves on Thursdays.   My name is  and I assist [place]. If you need anything before your next appointment, please do not hesitate to call the office at [phone] and ask to leave a message for me. I will respond within 24-48 business hours.

## 2022-10-06 NOTE — Progress Notes (Signed)
Subjective:    Patient ID: Tonya Murphy, female    DOB: 1941/05/29, 82 y.o.   MRN: [id number]  HPI She has had increasing left hip pain over the last six to eight weeks.  She has had bilateral hip pain over time but it has gotten much worse recently.  She has no falls or trauma.  She has pain moving the left hip.  X-rays were done at [facility] on 09-25-22.  I have reviewed the films as well as medical notes.  She has tried Tylenol, ice and heat with no help.  She has had cortisone injection with no help.  She is tried of hurting.  She lives alone.   Review of Systems  Constitutional:  Positive for activity change.  Musculoskeletal:  Positive for arthralgias, gait problem and myalgias.  All other systems reviewed and are negative. For Review of Systems, all other systems reviewed and are negative.  The following is a summary of the past history medically, past history surgically, known current medicines, social history and family history.  This information is gathered electronically by the computer from prior information and documentation.  I review this each visit and have found including this information at this point in the chart is beneficial and informative.   Past Medical History:  Diagnosis Date   Arthritis    Compression fracture of body of thoracic vertebra (HCC)    COPD (chronic obstructive pulmonary disease) ([place]) with exertion   Hyperlipidemia    Osteoporosis     Past Surgical History:  Procedure Laterality Date   BACK SURGERY  2017, 2019   COMPRESSION FRACTURES   BREAST BIOPSY Left 2019   [facility]   EYE SURGERY  lasik ou   2001   EYE SURGERY  cataract ou   2010    Current Outpatient Medications on File Prior to Visit  Medication Sig Dispense Refill   amLODipine (NORVASC) 5 MG tablet Take 5 mg by mouth daily.     aspirin EC 81 MG tablet Take 81 mg by mouth daily. On hold for procedure      BIOTIN PO Take 1 tablet by mouth  daily.       buPROPion (WELLBUTRIN SR) 150 MG 12 hr tablet Take 150 mg by mouth daily.     calcitonin, salmon, (MIACALCIN/FORTICAL) 200 UNIT/ACT nasal spray Place 1 spray into the nose daily.       Calcium Carbonate-Vitamin D (CALCIUM + D PO) Take 2-3 tablets by mouth daily.      cyanocobalamin (,VITAMIN B-12,) 1000 MCG/ML injection Inject 100 mcg into the muscle every 7 (seven) days.       dicyclomine (BENTYL) 10 MG capsule Take 1 capsule by mouth daily.     diphenoxylate-atropine (LOMOTIL) 2.5-0.025 MG tablet 1 PO QHS AND MAY REPEAT DURING THE DAY. NO MORE THAN 2 TABS A DAY. 45 tablet 11   levothyroxine (SYNTHROID) 50 MCG tablet Take 50 mcg by mouth daily.     Melatonin 10 MG TABS Take by mouth at bedtime.     metoprolol succinate (TOPROL-XL) 25 MG 24 hr tablet Take 25 mg by mouth 2 (two) times daily.     omeprazole (PRILOSEC) 20 MG capsule Take 20 mg by mouth daily.       simvastatin (ZOCOR) 80 MG tablet Take 80 mg by mouth daily.       traMADol (ULTRAM) 50 MG tablet Take 50 mg by mouth every 6 (six) hours.  TRELEGY ELLIPTA 100-62.5-25 MCG/ACT AEPB Inhale 1 puff into the lungs daily.     vitamin B-12 (CYANOCOBALAMIN) 50 MCG tablet Take 50 mcg by mouth as needed.     No current facility-administered medications on file prior to visit.    Social History   Socioeconomic History   Marital status: Married    Spouse name: Not on file   Number of children: Not on file   Years of education: Not on file   Highest education level: Not on file  Occupational History   Not on file  Tobacco Use   Smoking status: Every Day    Packs/day: 1.00    Types: Cigarettes   Smokeless tobacco: Never  Substance and Sexual Activity   Alcohol use: Yes    Comment: occ   Drug use: No   Sexual activity: Not on file  Other Topics Concern   Not on file  Social History Narrative   RETIRED: [employer].   DOES INCOME TAX ON THE SIDE.   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 IN [place]   HOBBIES: READS, CLEANING HER HOUSE   Social Determinants of Health   Financial Resource Strain: Not on file  Food Insecurity: Not on file  Transportation Needs: Not on file  Physical Activity: Not on file  Stress: Not on file  Social Connections: Not on file  Intimate Partner Violence: Not on file    Family History  Problem Relation Age of Onset   Crohn's disease Grandchild    Breast cancer Sister    Colon cancer Neg Hx    Colon polyps Neg Hx    Celiac disease Neg Hx     BP 138/73   Pulse 69   Ht 5' (1.524 m)   Wt 136 lb (61.7 kg)   BMI 26.56 kg/m   Body mass index is 26.56 kg/m.      Objective:   Physical Exam Vitals and nursing note reviewed. Exam conducted with a chaperone present.  Constitutional:      Appearance: She is well-developed.  HENT:     Head: Normocephalic and atraumatic.  Eyes:     Conjunctiva/sclera: Conjunctivae normal.     Pupils: Pupils are equal, round, and reactive to light.  Cardiovascular:     Rate and Rhythm: Normal rate and regular rhythm.  Pulmonary:     Effort: Pulmonary effort is normal.  Abdominal:     Palpations: Abdomen is soft.  Musculoskeletal:     Cervical back: Normal range of motion and neck supple.       Legs:  Skin:    General: Skin is warm and dry.  Neurological:     Mental Status: She is alert and oriented to person, place, and time.     Cranial Nerves: No cranial nerve deficit.     Motor: No abnormal muscle tone.     Coordination: Coordination normal.     Deep Tendon Reflexes: Reflexes are normal and symmetric. Reflexes normal.  Psychiatric:        Behavior: Behavior normal.        Thought Content: Thought content normal.        Judgment: Judgment normal.   I have independently reviewed and interpreted x-rays of this patient done at another site by another physician or qualified health professional.         Assessment & Plan:   Encounter Diagnoses  Name Primary?   Chronic left hip pain Yes    Primary osteoarthritis of  left hip    I have shown her the X-rays and explained the findings.  She has worn out her left hip.  I have recommended consideration of a total hip.  She does not want to have any surgery at this time and will "tough it out".  I have explained that when she would like to consider surgery, let me know and I can arrange it.  Consider cane or walker.  Return prn.  Call if any problem.  Precautions discussed.  Electronically Signed Sanjuana Kava, MD 1/2/20242:32 PM

## 2022-10-21 ENCOUNTER — Telehealth: Payer: Self-pay | Admitting: Orthopaedic Surgery

## 2022-10-21 NOTE — Telephone Encounter (Signed)
Patient called, stated she is ready to have hip surgery.  She does not want to be referred to Dr. Ninfa Linden, she wants to be referred to Dr. Rod Can.  Pt's # [phone]  She would like Abby to call her.

## 2022-10-23 ENCOUNTER — Other Ambulatory Visit: Payer: Self-pay

## 2022-10-23 DIAGNOSIS — M1612 Unilateral primary osteoarthritis, left hip: Secondary | ICD-10-CM

## 2022-10-23 DIAGNOSIS — G8929 Other chronic pain: Secondary | ICD-10-CM

## 2022-10-23 NOTE — Telephone Encounter (Signed)
REFERRAL ENTERED FOR EMERGEORTHO DR.BRIAN SWINTECK. REFERRAL UPLOADED TO [place]

## 2022-11-01 DIAGNOSIS — Z888 Allergy status to other drugs, medicaments and biological substances status: Secondary | ICD-10-CM | POA: Diagnosis not present

## 2022-11-01 DIAGNOSIS — Z7982 Long term (current) use of aspirin: Secondary | ICD-10-CM | POA: Diagnosis not present

## 2022-11-01 DIAGNOSIS — Z885 Allergy status to narcotic agent status: Secondary | ICD-10-CM | POA: Diagnosis not present

## 2022-11-01 DIAGNOSIS — F17210 Nicotine dependence, cigarettes, uncomplicated: Secondary | ICD-10-CM | POA: Diagnosis not present

## 2022-11-01 DIAGNOSIS — W130XXA Fall from, out of or through balcony, initial encounter: Secondary | ICD-10-CM | POA: Diagnosis not present

## 2022-11-01 DIAGNOSIS — S42212A Unspecified displaced fracture of surgical neck of left humerus, initial encounter for closed fracture: Secondary | ICD-10-CM | POA: Diagnosis not present

## 2022-11-01 DIAGNOSIS — S42412A Displaced simple supracondylar fracture without intercondylar fracture of left humerus, initial encounter for closed fracture: Secondary | ICD-10-CM | POA: Diagnosis not present

## 2022-11-02 DIAGNOSIS — S42292A Other displaced fracture of upper end of left humerus, initial encounter for closed fracture: Secondary | ICD-10-CM | POA: Diagnosis not present

## 2022-11-03 ENCOUNTER — Encounter: Payer: Self-pay | Admitting: Orthopedic Surgery

## 2022-11-03 ENCOUNTER — Other Ambulatory Visit: Payer: Self-pay | Admitting: Orthopedic Surgery

## 2022-11-03 ENCOUNTER — Ambulatory Visit
Admission: RE | Admit: 2022-11-03 | Discharge: 2022-11-03 | Disposition: A | Discharge status: Home / Self Care | Payer: Medicare HMO | Source: Ambulatory Visit | Attending: Orthopedic Surgery | Admitting: Orthopedic Surgery

## 2022-11-03 ENCOUNTER — Other Ambulatory Visit (HOSPITAL_COMMUNITY): Payer: Self-pay

## 2022-11-03 DIAGNOSIS — S42262A Displaced fracture of lesser tuberosity of left humerus, initial encounter for closed fracture: Secondary | ICD-10-CM | POA: Diagnosis not present

## 2022-11-03 DIAGNOSIS — S42252A Displaced fracture of greater tuberosity of left humerus, initial encounter for closed fracture: Secondary | ICD-10-CM | POA: Diagnosis not present

## 2022-11-03 DIAGNOSIS — S4292XA Fracture of left shoulder girdle, part unspecified, initial encounter for closed fracture: Secondary | ICD-10-CM

## 2022-11-03 DIAGNOSIS — D179 Benign lipomatous neoplasm, unspecified: Secondary | ICD-10-CM | POA: Diagnosis not present

## 2022-11-03 DIAGNOSIS — S42212A Unspecified displaced fracture of surgical neck of left humerus, initial encounter for closed fracture: Secondary | ICD-10-CM | POA: Diagnosis not present

## 2022-11-04 ENCOUNTER — Encounter (HOSPITAL_COMMUNITY): Payer: Self-pay | Admitting: Orthopedic Surgery

## 2022-11-04 NOTE — Anesthesia Preprocedure Evaluation (Signed)
Anesthesia Evaluation  Patient identified by MRN, date of birth, ID band Patient awake    Reviewed: Allergy & Precautions, NPO status , Patient's Chart, lab work & pertinent test results, reviewed documented beta blocker date and time   History of Anesthesia Complications Negative for: history of anesthetic complications  Airway Mallampati: II  TM Distance: >3 FB Neck ROM: Full    Dental no notable dental hx.    Pulmonary COPD,  COPD inhaler, Current Smoker   Pulmonary exam normal        Cardiovascular hypertension, Pt. on medications and Pt. on home beta blockers Normal cardiovascular exam     Neuro/Psych negative neurological ROS  negative psych ROS   GI/Hepatic Neg liver ROS,GERD  Medicated and Controlled,,  Endo/Other  Hypothyroidism    Renal/GU negative Renal ROS  negative genitourinary   Musculoskeletal  (+) Arthritis ,  left proximal humerus fracture   Abdominal   Peds  Hematology negative hematology ROS (+)   Anesthesia Other Findings Day of surgery medications reviewed with patient.  Reproductive/Obstetrics negative OB ROS                             Anesthesia Physical Anesthesia Plan  ASA: 2  Anesthesia Plan: General   Post-op Pain Management: Regional block* and Tylenol PO (pre-op)*   Induction: Intravenous  PONV Risk Score and Plan: 2 and Treatment may vary due to age or medical condition, Ondansetron and Dexamethasone  Airway Management Planned: Oral ETT  Additional Equipment: None  Intra-op Plan:   Post-operative Plan: Extubation in OR  Informed Consent: I have reviewed the patients History and Physical, chart, labs and discussed the procedure including the risks, benefits and alternatives for the proposed anesthesia with the patient or authorized representative who has indicated his/her understanding and acceptance.     Dental advisory given  Plan  Discussed with: CRNA  Anesthesia Plan Comments: (PAT note written 11/04/2022 by Myra Gianotti, PA-C.  )       Anesthesia Quick Evaluation

## 2022-11-04 NOTE — Progress Notes (Signed)
Anesthesia Chart Review: SAME DAY WORK-UP  Case: [id number] Date/Time: 11/05/22 1500   Procedures:      REVERSE SHOULDER ARTHROPLASTY (Left: Shoulder)     OPEN REDUCTION INTERNAL FIXATION (ORIF) PROXIMAL HUMERUS FRACTURE (Left)   Anesthesia type: General   Pre-op diagnosis: left proximal humerus fracture   Location: [facility] OR ROOM 05 / [place] [facility]   Surgeons: Marchia Bond, MD       DISCUSSION: Patient is a 82 year old female scheduled for the above procedure. She fell on 11/01/22 and sustained a comminuted, mildly displaced left humeral head and neck fracture.  History includes smoking, COPD, HTN, HLD, hypothyroidism, GERD, AAA (3.6 cm 02/26/22 [place]), hearing loss, spinal surgery (kyphoplasty T7 04/28/16).   She is a same day work-up. Labs and EKG as indicated on arrival. Anesthesia team to evaluate on the day of surgery. Last ASA 11/03/22.   VS: Ht 5' (1.524 m)   Wt 61.7 kg   BMI 26.57 kg/m  Blood Pressure 132/69 11/01/2022 3:23 PM EST  Pulse 62 11/01/2022 3:25 PM EST  Temperature 36.6 C (97.8 F) 11/01/2022 3:25 PM EST  Respiratory Rate 18 11/01/2022 3:25 PM EST  Oxygen Saturation 96% 11/01/2022 3:25 PM EST  Inhaled Oxygen Concentration - -  Weight 59 kg (130 lb) 11/01/2022 1:28 PM EST  Height 152.4 cm (5') 11/01/2022 1:28 PM EST  Body Mass Index 25.39 11/01/2022 1:28 PM EST     PROVIDERS: Glenda Chroman, MD is PCP    LABS: For day of surgery as indicated.    IMAGES: CT Left Shoulder 11/03/22: IMPRESSION: Comminuted and displaced fracture of the proximal humerus through the surgical neck and greater tuberosity. Up to 1.0 cm greater tuberosity displacement. Fracture extension through the base of the lesser tuberosity and to the inferior articular surface of the humeral head. No significant muscle atrophy. No glenoid fracture or significant glenoid bone loss.   In the proximal humeral diaphysis, 2-3 cm below the surgical neck, there is a small intramedullary ovoid hyperdensity  measuring 0.9 x 0.8 x 1.1 cm, favored to represent posttraumatic intramedullary hemorrhage, in the absence of a known malignancy. No aggressive features. No prior exam for direct comparison.    [place] Abd Aorta 02/26/22 [facility] [place]): FINDINGS:  Abdominal aortic measurements as follows:  Proximal:  3.4 cm  Mid:  3.6 cm  Distal:  1.9 cm  Right iliac artery: 1.6 cm  Left iliac artery: 1.2 cm  IMPRESSION: Recommend follow-up every 2 years. This recommendation follows ACR  consensus guidelines: White Paper of the ACR Incidental Findings  Committee II on Vascular Findings. J Am Coll Radiol 2013;  10:789-794. Aortic aneurysm NOS (ICD10-I71.9).    EKG: For day of surgery as indicated.    CV: N/A  Past Medical History:  Diagnosis Date   Abdominal aortic aneurysm (AAA) ([place]) 02/26/2022   in [place]   Arthritis    Compression fracture of body of thoracic vertebra (HCC)    COPD (chronic obstructive pulmonary disease) ([place]) with exertion   GERD (gastroesophageal reflux disease)    no current problems as of 11/04/22   Hyperlipidemia    Hypertension    Hypothyroidism    Osteoporosis    Sensory hearing loss 02/21/2020   bilateral - in [place], no hearing aids    Past Surgical History:  Procedure Laterality Date   BACK SURGERY  2017, 2019   COMPRESSION FRACTURES   BREAST BIOPSY Left 2019   [place]  EYE SURGERY  lasik ou   2001   EYE SURGERY  cataract ou   2010    MEDICATIONS: No current facility-administered medications for this encounter.    aspirin EC 81 MG tablet   amLODipine (NORVASC) 5 MG tablet   BIOTIN PO   buPROPion (WELLBUTRIN SR) 150 MG 12 hr tablet   calcitonin, salmon, (MIACALCIN/FORTICAL) 200 UNIT/ACT nasal spray   Calcium Carbonate-Vitamin D (CALCIUM + D PO)   cyanocobalamin (,VITAMIN B-12,) 1000 MCG/ML injection   dicyclomine (BENTYL) 10 MG capsule   diphenoxylate-atropine (LOMOTIL) 2.5-0.025 MG tablet   levothyroxine (SYNTHROID) 50 MCG  tablet   Melatonin 10 MG TABS   metoprolol succinate (TOPROL-XL) 25 MG 24 hr tablet   omeprazole (PRILOSEC) 20 MG capsule   simvastatin (ZOCOR) 80 MG tablet   traMADol (ULTRAM) 50 MG tablet   TRELEGY ELLIPTA 100-62.5-25 MCG/ACT AEPB   vitamin B-12 (CYANOCOBALAMIN) 50 MCG tablet    Myra Gianotti, PA-C Surgical Short Stay/Anesthesiology [facility] Phone [phone] [facility] Phone [phone] 11/04/2022 9:16 AM

## 2022-11-04 NOTE — Progress Notes (Signed)
PCP - Dr Woody Seller Cardiologist - n/a  Chest x-ray - n/a EKG - DOS Stress Test - n/a ECHO - n/a Cardiac Cath - n/a  ICD Pacemaker/Loop - n/a  Sleep Study -  n/a CPAP - none  Aspirin Instructions: Last Dose 11/03/22  ERAS: Clear liquids til 12:15 PM DOS.  Anesthesia review: Yes   STOP now taking any Aspirin (unless otherwise instructed by your surgeon), Aleve, Naproxen, Ibuprofen, Motrin, Advil, Goody's, BC's, all herbal medications, fish oil, and all vitamins.   Coronavirus Screening Do you have any of the following symptoms:  Cough yes/no: No Fever (>100.63F)  yes/no: No Runny nose yes/no: No Sore throat yes/no: No Difficulty breathing/shortness of breath  yes/no: No  Have you traveled in the last 14 days and where? yes/no: No  Patient/Daughter Tonya Murphy verbalized understanding of instructions that were given via phone.

## 2022-11-05 ENCOUNTER — Ambulatory Visit (HOSPITAL_COMMUNITY): Payer: Medicare HMO | Admitting: Vascular Surgery

## 2022-11-05 ENCOUNTER — Ambulatory Visit (HOSPITAL_COMMUNITY): Payer: Medicare HMO

## 2022-11-05 ENCOUNTER — Encounter (HOSPITAL_COMMUNITY): Payer: Self-pay | Admitting: Orthopedic Surgery

## 2022-11-05 ENCOUNTER — Ambulatory Visit (HOSPITAL_BASED_OUTPATIENT_CLINIC_OR_DEPARTMENT_OTHER): Payer: Medicare HMO | Admitting: Vascular Surgery

## 2022-11-05 ENCOUNTER — Encounter (HOSPITAL_COMMUNITY)
Admission: RE | Disposition: A | Discharge status: Home / Self Care | Payer: Self-pay | Source: Home / Self Care | Attending: Orthopedic Surgery

## 2022-11-05 ENCOUNTER — Observation Stay (HOSPITAL_COMMUNITY)
Admission: RE | Admit: 2022-11-05 | Discharge: 2022-11-05 | Disposition: A | Discharge status: Home / Self Care | Payer: Medicare HMO | Attending: Orthopedic Surgery | Admitting: Orthopedic Surgery

## 2022-11-05 ENCOUNTER — Other Ambulatory Visit: Payer: Self-pay

## 2022-11-05 DIAGNOSIS — G8918 Other acute postprocedural pain: Secondary | ICD-10-CM | POA: Diagnosis not present

## 2022-11-05 DIAGNOSIS — J449 Chronic obstructive pulmonary disease, unspecified: Secondary | ICD-10-CM | POA: Insufficient documentation

## 2022-11-05 DIAGNOSIS — Z79899 Other long term (current) drug therapy: Secondary | ICD-10-CM | POA: Insufficient documentation

## 2022-11-05 DIAGNOSIS — I10 Essential (primary) hypertension: Secondary | ICD-10-CM | POA: Diagnosis not present

## 2022-11-05 DIAGNOSIS — S42202A Unspecified fracture of upper end of left humerus, initial encounter for closed fracture: Secondary | ICD-10-CM

## 2022-11-05 DIAGNOSIS — F17210 Nicotine dependence, cigarettes, uncomplicated: Secondary | ICD-10-CM | POA: Diagnosis not present

## 2022-11-05 DIAGNOSIS — Z9889 Other specified postprocedural states: Secondary | ICD-10-CM | POA: Diagnosis not present

## 2022-11-05 DIAGNOSIS — Z7982 Long term (current) use of aspirin: Secondary | ICD-10-CM | POA: Insufficient documentation

## 2022-11-05 DIAGNOSIS — X58XXXA Exposure to other specified factors, initial encounter: Secondary | ICD-10-CM | POA: Diagnosis not present

## 2022-11-05 DIAGNOSIS — S42292A Other displaced fracture of upper end of left humerus, initial encounter for closed fracture: Secondary | ICD-10-CM | POA: Diagnosis not present

## 2022-11-05 DIAGNOSIS — E039 Hypothyroidism, unspecified: Secondary | ICD-10-CM | POA: Diagnosis not present

## 2022-11-05 HISTORY — PX: ORIF HUMERUS FRACTURE: SHX2126

## 2022-11-05 HISTORY — DX: Essential (primary) hypertension: I10

## 2022-11-05 HISTORY — DX: Gastro-esophageal reflux disease without esophagitis: K21.9

## 2022-11-05 HISTORY — DX: Hypothyroidism, unspecified: E03.9

## 2022-11-05 LAB — CBC
HCT: 44.3 % (ref 36.0–46.0)
Hemoglobin: 14 g/dL (ref 12.0–15.0)
MCH: 32.3 pg (ref 26.0–34.0)
MCHC: 31.6 g/dL (ref 30.0–36.0)
MCV: 102.1 fL — ABNORMAL HIGH (ref 80.0–100.0)
Platelets: 313 10*3/uL (ref 150–400)
RBC: 4.34 MIL/uL (ref 3.87–5.11)
RDW: 13.6 % (ref 11.5–15.5)
WBC: 12.4 10*3/uL — ABNORMAL HIGH (ref 4.0–10.5)
nRBC: 0 % (ref 0.0–0.2)

## 2022-11-05 LAB — BASIC METABOLIC PANEL
Anion gap: 8 (ref 5–15)
BUN: 15 mg/dL (ref 8–23)
CO2: 22 mmol/L (ref 22–32)
Calcium: 8.9 mg/dL (ref 8.9–10.3)
Chloride: 108 mmol/L (ref 98–111)
Creatinine, Ser: 0.77 mg/dL (ref 0.44–1.00)
GFR, Estimated: >60 mL/min (ref >60)
Glucose, Bld: 90 mg/dL (ref 70–99)
Potassium: 3.7 mmol/L (ref 3.5–5.1)
Sodium: 138 mmol/L (ref 135–145)

## 2022-11-05 LAB — SURGICAL PCR SCREEN
MRSA, PCR: NEGATIVE
Staphylococcus aureus: NEGATIVE

## 2022-11-05 SURGERY — OPEN REDUCTION INTERNAL FIXATION (ORIF) PROXIMAL HUMERUS FRACTURE
Anesthesia: Regional | Laterality: Left

## 2022-11-05 MED ORDER — CHLORHEXIDINE GLUCONATE 0.12 % MT SOLN
OROMUCOSAL | Status: AC
  Administered 2022-11-05: 15 mL via OROMUCOSAL
  Filled 2022-11-05: qty 15

## 2022-11-05 MED ORDER — FENTANYL CITRATE (PF) 100 MCG/2ML IJ SOLN
INTRAMUSCULAR | Status: AC
  Administered 2022-11-05: 50 ug via INTRAVENOUS
  Filled 2022-11-05: qty 2

## 2022-11-05 MED ORDER — PHENOL 1.4 % MT LIQD: 1.0000 | OROMUCOSAL | Status: DC | PRN

## 2022-11-05 MED ORDER — 0.9 % SODIUM CHLORIDE (POUR BTL) OPTIME
TOPICAL | Status: DC | PRN
  Administered 2022-11-05: 1000 mL

## 2022-11-05 MED ORDER — DEXAMETHASONE SODIUM PHOSPHATE 10 MG/ML IJ SOLN
INTRAMUSCULAR | Status: AC
  Filled 2022-11-05: qty 1

## 2022-11-05 MED ORDER — POLYETHYLENE GLYCOL 3350 17 G PO PACK: 17.0000 g | PACK | Freq: Every day | ORAL | Status: DC | PRN

## 2022-11-05 MED ORDER — METOPROLOL SUCCINATE ER 25 MG PO TB24: 25.0000 mg | ORAL_TABLET | Freq: Two times a day (BID) | ORAL | Status: DC

## 2022-11-05 MED ORDER — ALBUMIN HUMAN 5 % IV SOLN: INTRAVENOUS | Status: DC | PRN

## 2022-11-05 MED ORDER — ONDANSETRON HCL 4 MG PO TABS: 4.0000 mg | ORAL_TABLET | Freq: Three times a day (TID) | ORAL | 0 refills | Status: DC | PRN

## 2022-11-05 MED ORDER — MAGNESIUM CITRATE PO SOLN: 1.0000 | Freq: Once | ORAL | Status: DC | PRN

## 2022-11-05 MED ORDER — EPHEDRINE SULFATE-NACL 50-0.9 MG/10ML-% IV SOSY
PREFILLED_SYRINGE | INTRAVENOUS | Status: DC | PRN
  Administered 2022-11-05 (×2): 10 mg via INTRAVENOUS

## 2022-11-05 MED ORDER — HYDROCODONE-ACETAMINOPHEN 10-325 MG PO TABS: 0.5000 | ORAL_TABLET | Freq: Four times a day (QID) | ORAL | 0 refills | Status: DC | PRN

## 2022-11-05 MED ORDER — UMECLIDINIUM BROMIDE 62.5 MCG/ACT IN AEPB: 1.0000 | INHALATION_SPRAY | Freq: Every day | RESPIRATORY_TRACT | Status: DC

## 2022-11-05 MED ORDER — FENTANYL CITRATE (PF) 250 MCG/5ML IJ SOLN
INTRAMUSCULAR | Status: AC
  Filled 2022-11-05: qty 5

## 2022-11-05 MED ORDER — BUPROPION HCL ER (SR) 150 MG PO TB12: 150.0000 mg | ORAL_TABLET | Freq: Every morning | ORAL | Status: DC

## 2022-11-05 MED ORDER — TRANEXAMIC ACID-NACL 1000-0.7 MG/100ML-% IV SOLN: 1000.0000 mg | Freq: Once | INTRAVENOUS | Status: DC

## 2022-11-05 MED ORDER — TRANEXAMIC ACID-NACL 1000-0.7 MG/100ML-% IV SOLN
1000.0000 mg | INTRAVENOUS | Status: AC
  Administered 2022-11-05: 1000 mg via INTRAVENOUS

## 2022-11-05 MED ORDER — LIDOCAINE 2% (20 MG/ML) 5 ML SYRINGE
INTRAMUSCULAR | Status: AC
  Filled 2022-11-05: qty 5

## 2022-11-05 MED ORDER — DIPHENHYDRAMINE HCL 12.5 MG/5ML PO ELIX: 12.5000 mg | ORAL_SOLUTION | ORAL | Status: DC | PRN

## 2022-11-05 MED ORDER — ATORVASTATIN CALCIUM 40 MG PO TABS: 40.0000 mg | ORAL_TABLET | Freq: Every day | ORAL | Status: DC

## 2022-11-05 MED ORDER — BUPIVACAINE LIPOSOME 1.3 % IJ SUSP
INTRAMUSCULAR | Status: DC | PRN
  Administered 2022-11-05: 10 mL via PERINEURAL

## 2022-11-05 MED ORDER — POVIDONE-IODINE 10 % EX SWAB
2.0000 | Freq: Once | CUTANEOUS | Status: AC
  Administered 2022-11-05: 2 via TOPICAL

## 2022-11-05 MED ORDER — METOCLOPRAMIDE HCL 5 MG/ML IJ SOLN: 5.0000 mg | Freq: Three times a day (TID) | INTRAMUSCULAR | Status: DC | PRN

## 2022-11-05 MED ORDER — PHENYLEPHRINE 80 MCG/ML (10ML) SYRINGE FOR IV PUSH (FOR BLOOD PRESSURE SUPPORT)
PREFILLED_SYRINGE | INTRAVENOUS | Status: DC | PRN
  Administered 2022-11-05 (×3): 80 ug via INTRAVENOUS
  Administered 2022-11-05: 240 ug via INTRAVENOUS

## 2022-11-05 MED ORDER — BUPIVACAINE-EPINEPHRINE (PF) 0.5% -1:200000 IJ SOLN
INTRAMUSCULAR | Status: DC | PRN
  Administered 2022-11-05: 15 mL via PERINEURAL

## 2022-11-05 MED ORDER — LEVOTHYROXINE SODIUM 50 MCG PO TABS: 50.0000 ug | ORAL_TABLET | Freq: Every day | ORAL | Status: DC

## 2022-11-05 MED ORDER — CEFAZOLIN SODIUM-DEXTROSE 2-4 GM/100ML-% IV SOLN
2.0000 g | INTRAVENOUS | Status: AC
  Administered 2022-11-05: 2 g via INTRAVENOUS

## 2022-11-05 MED ORDER — ONDANSETRON HCL 4 MG/2ML IJ SOLN
INTRAMUSCULAR | Status: DC | PRN
  Administered 2022-11-05: 4 mg via INTRAVENOUS

## 2022-11-05 MED ORDER — ONDANSETRON HCL 4 MG PO TABS: 4.0000 mg | ORAL_TABLET | Freq: Four times a day (QID) | ORAL | Status: DC | PRN

## 2022-11-05 MED ORDER — HYDROCODONE-ACETAMINOPHEN 7.5-325 MG PO TABS: 1.0000 | ORAL_TABLET | ORAL | Status: DC | PRN

## 2022-11-05 MED ORDER — FENTANYL CITRATE (PF) 100 MCG/2ML IJ SOLN: 25.0000 ug | INTRAMUSCULAR | Status: DC | PRN

## 2022-11-05 MED ORDER — CEFAZOLIN SODIUM-DEXTROSE 1-4 GM/50ML-% IV SOLN: 1.0000 g | Freq: Four times a day (QID) | INTRAVENOUS | Status: DC

## 2022-11-05 MED ORDER — MENTHOL 3 MG MT LOZG: 1.0000 | LOZENGE | OROMUCOSAL | Status: DC | PRN

## 2022-11-05 MED ORDER — MORPHINE SULFATE (PF) 2 MG/ML IV SOLN: 0.5000 mg | INTRAVENOUS | Status: DC | PRN

## 2022-11-05 MED ORDER — LACTATED RINGERS IV SOLN: INTRAVENOUS | Status: DC

## 2022-11-05 MED ORDER — ROCURONIUM BROMIDE 10 MG/ML (PF) SYRINGE
PREFILLED_SYRINGE | INTRAVENOUS | Status: DC | PRN
  Administered 2022-11-05: 50 mg via INTRAVENOUS
  Administered 2022-11-05: 20 mg via INTRAVENOUS

## 2022-11-05 MED ORDER — EPHEDRINE 5 MG/ML INJ
INTRAVENOUS | Status: AC
  Filled 2022-11-05: qty 5

## 2022-11-05 MED ORDER — ALUM & MAG HYDROXIDE-SIMETH 200-200-20 MG/5ML PO SUSP: 30.0000 mL | ORAL | Status: DC | PRN

## 2022-11-05 MED ORDER — FLUTICASONE FUROATE-VILANTEROL 100-25 MCG/ACT IN AEPB: 1.0000 | INHALATION_SPRAY | Freq: Every day | RESPIRATORY_TRACT | Status: DC

## 2022-11-05 MED ORDER — AMISULPRIDE (ANTIEMETIC) 5 MG/2ML IV SOLN: 10.0000 mg | Freq: Once | INTRAVENOUS | Status: DC | PRN

## 2022-11-05 MED ORDER — ORAL CARE MOUTH RINSE: 15.0000 mL | Freq: Once | OROMUCOSAL | Status: AC

## 2022-11-05 MED ORDER — LACTATED RINGERS IV BOLUS: 500.0000 mL | Freq: Once | INTRAVENOUS | Status: DC

## 2022-11-05 MED ORDER — DEXAMETHASONE SODIUM PHOSPHATE 10 MG/ML IJ SOLN
INTRAMUSCULAR | Status: DC | PRN
  Administered 2022-11-05: 10 mg via INTRAVENOUS

## 2022-11-05 MED ORDER — ONDANSETRON HCL 4 MG/2ML IJ SOLN
INTRAMUSCULAR | Status: AC
  Filled 2022-11-05: qty 4

## 2022-11-05 MED ORDER — AMLODIPINE BESYLATE 5 MG PO TABS: 5.0000 mg | ORAL_TABLET | Freq: Every day | ORAL | Status: DC

## 2022-11-05 MED ORDER — CHLORHEXIDINE GLUCONATE 0.12 % MT SOLN: 15.0000 mL | Freq: Once | OROMUCOSAL | Status: AC

## 2022-11-05 MED ORDER — PHENYLEPHRINE 80 MCG/ML (10ML) SYRINGE FOR IV PUSH (FOR BLOOD PRESSURE SUPPORT)
PREFILLED_SYRINGE | INTRAVENOUS | Status: AC
  Filled 2022-11-05: qty 10

## 2022-11-05 MED ORDER — ACETAMINOPHEN 500 MG PO TABS: 500.0000 mg | ORAL_TABLET | Freq: Four times a day (QID) | ORAL | Status: DC

## 2022-11-05 MED ORDER — ONDANSETRON HCL 4 MG/2ML IJ SOLN: 4.0000 mg | Freq: Four times a day (QID) | INTRAMUSCULAR | Status: DC | PRN

## 2022-11-05 MED ORDER — MELATONIN 5 MG PO TABS: 5.0000 mg | ORAL_TABLET | Freq: Every day | ORAL | Status: DC

## 2022-11-05 MED ORDER — FENTANYL CITRATE (PF) 250 MCG/5ML IJ SOLN
INTRAMUSCULAR | Status: DC | PRN
  Administered 2022-11-05: 50 ug via INTRAVENOUS

## 2022-11-05 MED ORDER — LIDOCAINE 2% (20 MG/ML) 5 ML SYRINGE
INTRAMUSCULAR | Status: DC | PRN
  Administered 2022-11-05: 60 mg via INTRAVENOUS

## 2022-11-05 MED ORDER — DOCUSATE SODIUM 100 MG PO CAPS: 100.0000 mg | ORAL_CAPSULE | Freq: Two times a day (BID) | ORAL | Status: DC

## 2022-11-05 MED ORDER — ACETAMINOPHEN 325 MG PO TABS: 325.0000 mg | ORAL_TABLET | Freq: Four times a day (QID) | ORAL | Status: DC | PRN

## 2022-11-05 MED ORDER — BISACODYL 10 MG RE SUPP: 10.0000 mg | Freq: Every day | RECTAL | Status: DC | PRN

## 2022-11-05 MED ORDER — METOCLOPRAMIDE HCL 5 MG PO TABS: 5.0000 mg | ORAL_TABLET | Freq: Three times a day (TID) | ORAL | Status: DC | PRN

## 2022-11-05 MED ORDER — ACETAMINOPHEN 500 MG PO TABS: 1000.0000 mg | ORAL_TABLET | Freq: Once | ORAL | Status: AC

## 2022-11-05 MED ORDER — HYDROCODONE-ACETAMINOPHEN 5-325 MG PO TABS: 1.0000 | ORAL_TABLET | ORAL | Status: DC | PRN

## 2022-11-05 MED ORDER — MIDAZOLAM HCL 2 MG/2ML IJ SOLN
INTRAMUSCULAR | Status: AC
  Filled 2022-11-05: qty 2

## 2022-11-05 MED ORDER — CEFAZOLIN SODIUM-DEXTROSE 2-4 GM/100ML-% IV SOLN
INTRAVENOUS | Status: AC
  Filled 2022-11-05: qty 100

## 2022-11-05 MED ORDER — SUGAMMADEX SODIUM 200 MG/2ML IV SOLN
INTRAVENOUS | Status: DC | PRN
  Administered 2022-11-05 (×2): 100 mg via INTRAVENOUS

## 2022-11-05 MED ORDER — FENTANYL CITRATE (PF) 100 MCG/2ML IJ SOLN: 50.0000 ug | Freq: Once | INTRAMUSCULAR | Status: AC

## 2022-11-05 MED ORDER — PHENYLEPHRINE HCL-NACL 20-0.9 MG/250ML-% IV SOLN
INTRAVENOUS | Status: DC | PRN
  Administered 2022-11-05: 50 ug/min via INTRAVENOUS

## 2022-11-05 MED ORDER — ACETAMINOPHEN 500 MG PO TABS
ORAL_TABLET | ORAL | Status: AC
  Administered 2022-11-05: 1000 mg via ORAL
  Filled 2022-11-05: qty 2

## 2022-11-05 MED ORDER — TRANEXAMIC ACID-NACL 1000-0.7 MG/100ML-% IV SOLN
INTRAVENOUS | Status: AC
  Filled 2022-11-05: qty 100

## 2022-11-05 MED ORDER — PROPOFOL 10 MG/ML IV BOLUS
INTRAVENOUS | Status: DC | PRN
  Administered 2022-11-05: 100 mg via INTRAVENOUS

## 2022-11-05 MED ORDER — ROCURONIUM BROMIDE 10 MG/ML (PF) SYRINGE
PREFILLED_SYRINGE | INTRAVENOUS | Status: AC
  Filled 2022-11-05: qty 10

## 2022-11-05 MED ORDER — LACTATED RINGERS IV BOLUS: 250.0000 mL | Freq: Once | INTRAVENOUS | Status: DC

## 2022-11-05 SURGICAL SUPPLY — 82 items
APL SKNCLS STERI-STRIP NONHPOA (GAUZE/BANDAGES/DRESSINGS)
BAG COUNTER SPONGE SURGICOUNT (BAG) ×2 IMPLANT
BAG SPNG CNTER NS LX DISP (BAG) ×2
BENZOIN TINCTURE PRP APPL 2/3 (GAUZE/BANDAGES/DRESSINGS) ×1 IMPLANT
BIT DRILL 3.2 (BIT) ×2
BIT DRILL 3.2XCALB NS DISP (BIT) ×1 IMPLANT
BIT DRILL CALIBRATED 2.7 (BIT) ×1 IMPLANT
BIT DRL 3.2XCALB NS DISP (BIT) ×2
BLADE SAW SGTL 73X25 THK (BLADE) ×1 IMPLANT
BOOTCOVER CLEANROOM LRG (PROTECTIVE WEAR) ×4 IMPLANT
CLEANER TIP ELECTROSURG 2X2 (MISCELLANEOUS) IMPLANT
CLSR STERI-STRIP ANTIMIC 1/2X4 (GAUZE/BANDAGES/DRESSINGS) ×3 IMPLANT
COVER BACK TABLE 60X90IN (DRAPES) IMPLANT
COVER SURGICAL LIGHT HANDLE (MISCELLANEOUS) ×2 IMPLANT
DRAPE C-ARM 42X72 X-RAY (DRAPES) ×2 IMPLANT
DRAPE IMP U-DRAPE 54X76 (DRAPES) ×2 IMPLANT
DRAPE INCISE IOBAN 66X45 STRL (DRAPES) ×2 IMPLANT
DRAPE ORTHO SPLIT 77X108 STRL (DRAPES) ×4
DRAPE SURG ORHT 6 SPLT 77X108 (DRAPES) ×4 IMPLANT
DRAPE U-SHAPE 47X51 STRL (DRAPES) ×2 IMPLANT
DRSG AQUACEL AG ADV 3.5X10 (GAUZE/BANDAGES/DRESSINGS) ×2 IMPLANT
DRSG MEPILEX BORDER 4X8 (GAUZE/BANDAGES/DRESSINGS) IMPLANT
DURAPREP 26ML APPLICATOR (WOUND CARE) ×2 IMPLANT
ELECT REM PT RETURN 9FT ADLT (ELECTROSURGICAL) ×2
ELECTRODE REM PT RTRN 9FT ADLT (ELECTROSURGICAL) ×2 IMPLANT
GLOVE BIO SURGEON STRL SZ7 (GLOVE) ×2 IMPLANT
GLOVE BIOGEL PI IND STRL 7.0 (GLOVE) ×2 IMPLANT
GLOVE BIOGEL PI IND STRL 8 (GLOVE) ×2 IMPLANT
GLOVE ORTHO TXT STRL SZ7.5 (GLOVE) ×4 IMPLANT
GOWN STRL REUS W/ TWL LRG LVL3 (GOWN DISPOSABLE) ×2 IMPLANT
GOWN STRL REUS W/ TWL XL LVL3 (GOWN DISPOSABLE) ×2 IMPLANT
GOWN STRL REUS W/TWL LRG LVL3 (GOWN DISPOSABLE) ×2
GOWN STRL REUS W/TWL XL LVL3 (GOWN DISPOSABLE) ×2
HANDPIECE INTERPULSE COAX TIP (DISPOSABLE)
HOOD PEEL AWAY FACE SHEILD DIS (HOOD) ×1 IMPLANT
HOOD PEEL AWAY FLYTE STAYCOOL (MISCELLANEOUS) ×2 IMPLANT
K-WIRE 2X5 SS THRDED S3 (WIRE) ×4
KIT BASIN OR (CUSTOM PROCEDURE TRAY) ×2 IMPLANT
KIT TURNOVER KIT B (KITS) ×2 IMPLANT
KWIRE 2X5 SS THRDED S3 (WIRE) ×2 IMPLANT
MANIFOLD NEPTUNE II (INSTRUMENTS) ×2 IMPLANT
NDL HYPO 25GX1X1/2 BEV (NEEDLE) IMPLANT
NEEDLE HYPO 25GX1X1/2 BEV (NEEDLE) IMPLANT
NS IRRIG 1000ML POUR BTL (IV SOLUTION) ×2 IMPLANT
PACK SHOULDER (CUSTOM PROCEDURE TRAY) ×2 IMPLANT
PACK UNIVERSAL I (CUSTOM PROCEDURE TRAY) ×2 IMPLANT
PAD ARMBOARD 7.5X6 YLW CONV (MISCELLANEOUS) ×4 IMPLANT
PEG LOCKING 3.2MMX44 (Peg) ×1 IMPLANT
PEG LOCKING 3.2X34 (Screw) ×1 IMPLANT
PEG LOCKING 3.2X36 (Screw) ×3 IMPLANT
PEG LOCKING 3.2X38 (Screw) ×1 IMPLANT
PLATE HUMERUS LP PROX L 3H (Plate) ×1 IMPLANT
SCREW LOCK CORT STAR 3.5X22 (Screw) ×1 IMPLANT
SCREW LP NL T15 3.5X22 (Screw) ×1 IMPLANT
SET HNDPC FAN SPRY TIP SCT (DISPOSABLE) IMPLANT
SLEEVE MEASURING 3.2 (BIT) ×1 IMPLANT
SLING ARM FOAM STRAP MED (SOFTGOODS) ×1 IMPLANT
SLING ARM IMMOBILIZER LRG (SOFTGOODS) ×1 IMPLANT
SLING ARM IMMOBILIZER MED (SOFTGOODS) IMPLANT
SPIKE FLUID TRANSFER (MISCELLANEOUS) ×2 IMPLANT
SPONGE T-LAP 18X18 ~~LOC~~+RFID (SPONGE) ×2 IMPLANT
SPONGE T-LAP 4X18 ~~LOC~~+RFID (SPONGE) IMPLANT
STAPLER VISISTAT 35W (STAPLE) IMPLANT
SUCTION FRAZIER HANDLE 10FR (MISCELLANEOUS) ×2
SUCTION TUBE FRAZIER 10FR DISP (MISCELLANEOUS) ×2 IMPLANT
SUPPORT WRAP ARM LG (MISCELLANEOUS) ×2 IMPLANT
SUT FIBERWIRE #2 38 T-5 BLUE (SUTURE)
SUT MAXBRAID #5 CCS-NDL 2PK (SUTURE) ×2 IMPLANT
SUT MNCRL AB 4-0 PS2 18 (SUTURE) ×1 IMPLANT
SUT VIC AB 0 CT1 27 (SUTURE) ×2
SUT VIC AB 0 CT1 27XBRD ANBCTR (SUTURE) ×1 IMPLANT
SUT VIC AB 2-0 CT1 27 (SUTURE) ×2
SUT VIC AB 2-0 CT1 TAPERPNT 27 (SUTURE) ×2 IMPLANT
SUT VIC AB 3-0 FS2 27 (SUTURE) ×1 IMPLANT
SUT VIC AB 3-0 SH 8-18 (SUTURE) ×2 IMPLANT
SUTURE FIBERWR #2 38 T-5 BLUE (SUTURE) ×4 IMPLANT
SYR BULB IRRIG 60ML STRL (SYRINGE) ×2 IMPLANT
SYR CONTROL 10ML LL (SYRINGE) IMPLANT
TOWEL GREEN STERILE (TOWEL DISPOSABLE) ×2 IMPLANT
TOWEL GREEN STERILE FF (TOWEL DISPOSABLE) ×2 IMPLANT
TRAY FOLEY MTR SLVR 16FR STAT (SET/KITS/TRAYS/PACK) IMPLANT
WATER STERILE IRR 1000ML POUR (IV SOLUTION) ×2 IMPLANT

## 2022-11-05 NOTE — Anesthesia Procedure Notes (Signed)
Procedure Name: Intubation Date/Time: 11/05/2022 3:26 PM  Performed by: Lorie Phenix, CRNAPre-anesthesia Checklist: Patient identified, Emergency Drugs available, Suction available and Patient being monitored Patient Re-evaluated:Patient Re-evaluated prior to induction Oxygen Delivery Method: Circle system utilized Preoxygenation: Pre-oxygenation with 100% oxygen Induction Type: IV induction Ventilation: Mask ventilation without difficulty Laryngoscope Size: Mac and 3 Grade View: Grade I Tube type: Oral Tube size: 7.0 mm Number of attempts: 1 Airway Equipment and Method: Stylet Placement Confirmation: ETT inserted through vocal cords under direct vision, positive ETCO2 and breath sounds checked- equal and bilateral Secured at: 21 cm Tube secured with: Tape Dental Injury: Teeth and Oropharynx as per pre-operative assessment

## 2022-11-05 NOTE — Progress Notes (Signed)
Per [place], [place] and Mardelle Matte, MD, patient can be discharged to home without PT clearance if patient is ok with going home. Patient wants to go home

## 2022-11-05 NOTE — Discharge Instructions (Signed)
Diet: As you were doing prior to hospitalization   Shower/Dressing:  May shower but keep the wounds dry, use an occlusive plastic wrap, NO SOAKING IN TUB.  If the bandage gets wet, change with a clean dry gauze. Unless you see significant drainage coming through your dressing,  just leave the dressing in place and we will change your bandages during your first follow-up appointment.   There are sticky tapes (steri-strips) on your wounds and all the stitches are absorbable.  Leave the steri-strips in place if changing your dressings, they will peel off with time, usually 2-3 weeks.  Activity:  Increase activity slowly as tolerated, but follow the weight bearing instructions below.  The rules on driving is that you can not be taking narcotics while you drive, and you must feel in control of the vehicle.    Weight Bearing:   No bearing weight with left arm.    To prevent constipation: you may use a stool softener such as -  Colace (over the counter) 100 mg by mouth twice a day  Drink plenty of fluids (prune juice may be helpful) and high fiber foods Miralax (over the counter) for constipation as needed.    Itching:  If you experience itching with your medications, try taking only a single pain pill, or even half a pain pill at a time.  You may take up to 10 pain pills per day, and you can also use benadryl over the counter for itching or also to help with sleep.   Precautions:  If you experience chest pain or shortness of breath - call 911 immediately for transfer to the hospital emergency department!!  If you develop a fever greater that 101 F, purulent drainage from wound, increased redness or drainage from wound, or calf pain -- Call the office at [phone]                                                Follow- Up Appointment:  Please call for an appointment to be seen in 2 weeks [place] - [phone]

## 2022-11-05 NOTE — Op Note (Signed)
11/05/2022  5:20 PM  PATIENT:  Tonya Murphy    PRE-OPERATIVE DIAGNOSIS:  left proximal humerus fracture  POST-OPERATIVE DIAGNOSIS:  Same  PROCEDURE:  OPEN REDUCTION INTERNAL FIXATION (ORIF) PROXIMAL HUMERUS FRACTURE  SURGEON:  Johnny Bridge, MD  PHYSICIAN ASSISTANT: Merlene Pulling, PA-C, present and scrubbed throughout the case, critical for completion in a timely fashion, and for retraction, instrumentation, and closure.  ANESTHESIA:   General  ESTIMATED BLOOD LOSS: 75 mL  PREOPERATIVE INDICATIONS:  Tonya Murphy is a  82 y.o. female with a diagnosis of left proximal humerus fracture who elected for surgical management.    The risks benefits and alternatives were discussed with the patient including but not limited to the risks of nonoperative treatment, versus surgical intervention including infection, bleeding, nerve injury, malunion, nonunion, the need for revision surgery, hardware prominence, hardware failure, the need for hardware removal, blood clots, cardiopulmonary complications, conversion to arthroplasty, morbidity, mortality, among others, and they were willing to proceed.  Predicted outcome is good, although there will be at least a six to nine month expected recovery.   OPERATIVE IMPLANTS: Biomet ALPS proximal humerus locking plate with 2 #5 max braid through the greater tuberosity and a 2 #5 max braid through the lesser tuberosity  OPERATIVE FINDINGS: Displaced proximal humerus fracture.  UNIQUE ASPECTS OF THE CASE: I was able to mobilize the greater tuberosity and lesser tuberosity reasonably well, and head was perched and remained in good position throughout the case and I was able to get it restored to near anatomic alignment.  OPERATIVE PROCEDURE: The patient was brought to the operating room and placed in the supine position. General anesthesia was administered. IV antibiotics were given. She was placed in the beach chair position. All bony prominences were  padded. The upper extremity was prepped and draped in usual sterile fashion. Deltopectoral incision was performed.  I exposed the fracture site, and placed deep retractors. I did not tenotomize the biceps tendon. This was left in place. I elevated a small portion of the deltoid off of the shaft, in order to gain access for the plate. I placed supraspinatus and subscapularis stitches, and then reduced the head onto the shaft. This was maintained in satisfactory position.  I applied the plate and secured it into the sliding hole first. I confirmed position of the reduction and the plate with C-arm, and I placed a total of 2 guidewires into the appropriate position in the head. I was satisfied that the plate was distal appropriately, and then secured the plate proximally with smooth pegs, taking care to prevent penetration into the arch articular surface, using C-arm, as well as manual feel using a hand drill.  I then secured the plate distally using another locking cortical screw. I then passed the max braid sutures from the subscapularis and supraspinatus through the plate and secured the tuberosities. Once complete fixation and reduction of been achieved, took final C-arm pictures, and irrigated the wounds copiously, and repaired the deltopectoral interval with Vicryl followed by Vicryl for the subcutaneous tissue with Monocryl and Steri-Strips for the skin. She was placed in a sling. She had a preoperative regional block as well. She tolerated the procedure well with no complications.

## 2022-11-05 NOTE — Transfer of Care (Signed)
Immediate Anesthesia Transfer of Care Note  Patient: Tonya Murphy  Procedure(s) Performed: OPEN REDUCTION INTERNAL FIXATION (ORIF) PROXIMAL HUMERUS FRACTURE (Left)  Patient Location: PACU  Anesthesia Type:General and Regional  Level of Consciousness: awake, alert , and oriented  Airway & Oxygen Therapy: Patient Spontanous Breathing and Patient connected to nasal cannula oxygen  Post-op Assessment: Report given to RN and Post -op Vital signs reviewed and stable  Post vital signs: Reviewed and stable  Last Vitals:  Vitals Value Taken Time  BP 120/56 11/05/22 1750  Temp    Pulse 67 11/05/22 1753  Resp 19 11/05/22 1753  SpO2 85 % 11/05/22 1753  Vitals shown include unvalidated device data.  Last Pain:  Vitals:   11/05/22 1445  TempSrc:   PainSc: 0-No pain         Complications: No notable events documented.

## 2022-11-05 NOTE — Progress Notes (Signed)
Orthopedic Tech Progress Note Patient Details:  Tonya Murphy 03-Jul-1941 [id number]   Use of a trapeze overhead frame not permitted for this pt due to contraindications and restrictions for use such as upper extremity injuries and pt age >/= 69.  Patient ID: Tonya Murphy, female   DOB: 1941/09/26, 82 y.o.   MRN: [id number]  Carin Primrose 11/05/2022, 6:49 PM

## 2022-11-05 NOTE — Anesthesia Procedure Notes (Addendum)
Anesthesia Regional Block: Interscalene brachial plexus block   Pre-Anesthetic Checklist: , timeout performed,  Correct Patient, Correct Site, Correct Laterality,  Correct Procedure, Correct Position, site marked,  Risks and benefits discussed,  Pre-op evaluation,  At surgeon's request and post-op pain management  Laterality: Left  Prep: Maximum Sterile Barrier Precautions used, chloraprep       Needles:  Injection technique: Single-shot  Needle Type: Echogenic Stimulator Needle     Needle Length: 4cm  Needle Gauge: 22     Additional Needles:   Procedures:,,,, ultrasound used (permanent image in chart),,    Narrative:  Start time: 11/05/2022 2:21 PM End time: 11/05/2022 2:24 PM Injection made incrementally with aspirations every 5 mL.  Performed by: Personally  Anesthesiologist: Brennan Bailey, MD  Additional Notes: Risks, benefits, and alternative discussed. Patient gave consent for procedure. Patient prepped and draped in sterile fashion. Sedation administered, patient remains easily responsive to voice. Relevant anatomy identified with ultrasound guidance. Local anesthetic given in 5cc increments with no signs or symptoms of intravascular injection. No pain or paraesthesias with injection. Patient monitored throughout procedure with signs of LAST or immediate complications. Tolerated well. Ultrasound image placed in chart.  Tawny Asal, MD

## 2022-11-05 NOTE — H&P (Signed)
PREOPERATIVE H&P  Chief Complaint: Left shoulder pain  HPI: Tonya Murphy is a 82 y.o. female who presents for preoperative history and physical with a diagnosis of left proximal humerus fracture with displacement. Symptoms are rated as moderate to severe, and have been worsening.  This is significantly impairing activities of daily living.  She has elected for surgical management.   Past Medical History:  Diagnosis Date   Abdominal aortic aneurysm (AAA) ([place]) 02/26/2022   in [place]   Arthritis    Compression fracture of body of thoracic vertebra (HCC)    COPD (chronic obstructive pulmonary disease) ([place]) with exertion   GERD (gastroesophageal reflux disease)    no current problems as of 11/04/22   Hyperlipidemia    Hypertension    Hypothyroidism    Osteoporosis    Sensory hearing loss 02/21/2020   bilateral - in [place], no hearing aids   Past Surgical History:  Procedure Laterality Date   BACK SURGERY  2017, 2019   COMPRESSION FRACTURES   BREAST BIOPSY Left 2019   Roe ou   2001   EYE SURGERY  cataract ou   2010   Social History   Socioeconomic History   Marital status: Married    Spouse name: Not on file   Number of children: Not on file   Years of education: Not on file   Highest education level: Not on file  Occupational History   Not on file  Tobacco Use   Smoking status: Every Day    Packs/day: 1.00    Types: Cigarettes   Smokeless tobacco: Never   Tobacco comments:    Cut back to 1/4 ppd   Vaping Use   Vaping Use: Every day   Substances: Nicotine  Substance and Sexual Activity   Alcohol use: Not Currently    Comment: occasional liquor   Drug use: No   Sexual activity: Not Currently    Birth control/protection: Post-menopausal  Other Topics Concern   Not on file  Social History Narrative   RETIRED: [employer].   DOES INCOME TAX ON THE SIDE.   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  IN [place]   HOBBIES: READS, CLEANING HER HOUSE   Social Determinants of Health   Financial Resource Strain: Not on file  Food Insecurity: Not on file  Transportation Needs: Not on file  Physical Activity: Not on file  Stress: Not on file  Social Connections: Not on file   Family History  Problem Relation Age of Onset   Crohn's disease Grandchild    Breast cancer Sister    Colon cancer Neg Hx    Colon polyps Neg Hx    Celiac disease Neg Hx    Allergies  Allergen Reactions   Actonel [Risedronate] Other (See Comments)    Gums swell and made teeth loose   Oxycodone Other (See Comments)    Makes patient emotional   Prior to Admission medications   Medication Sig Start Date End Date Taking? Authorizing Provider  acetaminophen (TYLENOL) 500 MG tablet Take 500-1,000 mg by mouth every 6 (six) hours as needed (pain.).   Yes [provider]  amLODipine (NORVASC) 5 MG tablet Take 5 mg by mouth daily. 08/21/22  Yes [provider]  aspirin EC 81 MG tablet Take 81 mg by mouth in the morning.   Yes [provider]  BIOTIN PO Take 1 tablet by mouth  in the morning.   Yes [provider]  buPROPion (WELLBUTRIN SR) 150 MG 12 hr tablet Take 150 mg by mouth in the morning.   Yes [provider]  Calcium Carbonate-Vitamin D (CALCIUM + D PO) Take 1 tablet by mouth in the morning.   Yes [provider]  levothyroxine (SYNTHROID) 50 MCG tablet Take 50 mcg by mouth daily before breakfast.   Yes [provider]  melatonin 5 MG TABS Take 5 mg by mouth at bedtime.   Yes [provider]  metoprolol succinate (TOPROL-XL) 25 MG 24 hr tablet Take 25 mg by mouth 2 (two) times daily.   Yes [provider]  simvastatin (ZOCOR) 80 MG tablet Take 80 mg by mouth in the morning.   Yes [provider]  TRELEGY ELLIPTA 100-62.5-25 MCG/ACT AEPB Inhale 1 puff into the lungs in the morning.   Yes [provider]      Positive ROS: All other systems have been reviewed and were otherwise negative with the exception of those mentioned in the HPI and as above.  Physical Exam: General: Alert, no acute distress Cardiovascular: No pedal edema Respiratory: No cyanosis, no use of accessory musculature GI: No organomegaly, abdomen is soft and non-tender Skin: No lesions in the area of chief complaint Neurologic: Sensation intact distally Psychiatric: Patient is competent for consent with normal mood and affect Lymphatic: No axillary or cervical lymphadenopathy  MUSCULOSKELETAL: Left shoulder is significant ecchymosis.  Neurologically intact distally  Assessment: Left proximal humerus fracture with displacement   Plan: Plan for Procedure(s): REVERSE SHOULDER ARTHROPLASTY versus OPEN REDUCTION INTERNAL FIXATION (ORIF) PROXIMAL HUMERUS FRACTURE  The risks benefits and alternatives were discussed with the patient including but not limited to the risks of nonoperative treatment, versus surgical intervention including infection, bleeding, nerve injury,  blood clots, cardiopulmonary complications, morbidity, mortality, among others, and they were willing to proceed.    Patient's anticipated LOS is less than 2 midnights, meeting these requirements: - Younger than 27 - Lives within 1 hour of care - Has a competent adult at home to recover with post-op recover - NO history of  - Chronic pain requiring opiods  - Diabetes  - Coronary Artery Disease  - Heart failure  - Heart attack  - Stroke  - DVT/VTE  - Cardiac arrhythmia  - Respiratory Failure/COPD  - Renal failure  - Anemia  - Advanced Liver disease      Johnny Bridge, MD Cell [phone]   11/05/2022 2:49 PM

## 2022-11-07 ENCOUNTER — Encounter (HOSPITAL_COMMUNITY): Payer: Self-pay | Admitting: Orthopedic Surgery

## 2022-11-09 NOTE — Anesthesia Postprocedure Evaluation (Signed)
Anesthesia Post Note  Patient: Tonya Murphy  Procedure(s) Performed: OPEN REDUCTION INTERNAL FIXATION (ORIF) PROXIMAL HUMERUS FRACTURE (Left)     Patient location during evaluation: PACU Anesthesia Type: Regional and General Level of consciousness: awake and alert Pain management: pain level controlled Vital Signs Assessment: post-procedure vital signs reviewed and stable Respiratory status: spontaneous breathing, nonlabored ventilation and respiratory function stable Cardiovascular status: blood pressure returned to baseline and stable Postop Assessment: no apparent nausea or vomiting Anesthetic complications: no   No notable events documented.  Last Vitals:  Vitals:   11/05/22 1850 11/05/22 1905  BP: (!) 115/55 (!) 112/54  Pulse: 61 64  Resp: 15 15  Temp:  37.1 C  SpO2: 92% 94%    Last Pain:  Vitals:   11/05/22 1905  TempSrc:   PainSc: 0-No pain                 Lindsi Bayliss

## 2022-11-09 NOTE — Discharge Summary (Signed)
Discharge Summary  Patient ID: Tonya Murphy MRN: [id number] DOB/AGE: March 15, 1941 82 y.o.  Admit date: 11/05/2022 Discharge date: 11/05/22  Admission Diagnoses:  Closed fracture of left proximal humerus  Discharge Diagnoses:  Principal Problem:   Closed fracture of left proximal humerus   Past Medical History:  Diagnosis Date   Abdominal aortic aneurysm (AAA) ([place]) 02/26/2022   in [place]   Arthritis    Compression fracture of body of thoracic vertebra (HCC)    COPD (chronic obstructive pulmonary disease) (HCC) with exertion   GERD (gastroesophageal reflux disease)    no current problems as of 11/04/22   Hyperlipidemia    Hypertension    Hypothyroidism    Osteoporosis    Sensory hearing loss 02/21/2020   bilateral - in [place], no hearing aids    Surgeries: Procedure(s): OPEN REDUCTION INTERNAL FIXATION (ORIF) PROXIMAL HUMERUS FRACTURE on 11/05/2022   Consultants (if any):   Discharged Condition: Improved  Hospital Course: Tonya Murphy is an 82 y.o. female who was admitted 11/05/2022 with a diagnosis of Closed fracture of left proximal humerus and went to the operating room on 11/05/2022 and underwent the above named procedures.    She was given perioperative antibiotics:  Anti-infectives (From admission, onward)    Start     Dose/Rate Route Frequency Ordered Stop   11/05/22 1845  ceFAZolin (ANCEF) IVPB 1 g/50 mL premix  Status:  Discontinued        1 g 100 mL/hr over 30 Minutes Intravenous Every 6 hours 11/05/22 1838 11/06/22 0040   11/05/22 1330  ceFAZolin (ANCEF) IVPB 2g/100 mL premix        2 g 200 mL/hr over 30 Minutes Intravenous On call to O.R. 11/05/22 1243 11/05/22 1530   11/05/22 1300  ceFAZolin (ANCEF) 2-4 GM/100ML-% IVPB       Note to Pharmacy: Harden Mo E: cabinet override      11/05/22 1300 11/05/22 1552     .  She was given sequential compression devices and early ambulation for DVT prophylaxis.  She benefited maximally from the hospital stay and there  were no complications.    Recent vital signs:  Vitals:   11/05/22 1850 11/05/22 1905  BP: (!) 115/55 (!) 112/54  Pulse: 61 64  Resp: 15 15  Temp:  98.7 F (37.1 C)  SpO2: 92% 94%    Recent laboratory studies:  Lab Results  Component Value Date   HGB 14.0 11/05/2022   HGB 16.6 (H) 09/02/2011   Lab Results  Component Value Date   WBC 12.4 (H) 11/05/2022   PLT 313 11/05/2022   No results found for: "INR" Lab Results  Component Value Date   NA 138 11/05/2022   K 3.7 11/05/2022   CL 108 11/05/2022   CO2 22 11/05/2022   BUN 15 11/05/2022   CREATININE 0.77 11/05/2022   GLUCOSE 90 11/05/2022    Discharge Medications:   Allergies as of 11/05/2022       Reactions   Actonel [risedronate] Other (See Comments)   Gums swell and made teeth loose   Oxycodone Other (See Comments)   Makes patient emotional        Medication List     STOP taking these medications    acetaminophen 500 MG tablet Commonly known as: TYLENOL   HYDROcodone-acetaminophen 5-325 MG tablet Commonly known as: NORCO/VICODIN Replaced by: HYDROcodone-acetaminophen 10-325 MG tablet       TAKE these medications    amLODipine 5 MG tablet Commonly known as:  NORVASC Take 5 mg by mouth daily.   aspirin EC 81 MG tablet Take 81 mg by mouth in the morning.   BIOTIN PO Take 1 tablet by mouth in the morning.   buPROPion 150 MG 12 hr tablet Commonly known as: WELLBUTRIN SR Take 150 mg by mouth in the morning.   CALCIUM + D PO Take 1 tablet by mouth in the morning.   HYDROcodone-acetaminophen 10-325 MG tablet Commonly known as: Norco Take 0.5-1 tablets by mouth every 6 (six) hours as needed. Replaces: HYDROcodone-acetaminophen 5-325 MG tablet   levothyroxine 50 MCG tablet Commonly known as: SYNTHROID Take 50 mcg by mouth daily before breakfast.   melatonin 5 MG Tabs Take 5 mg by mouth at bedtime.   metoprolol succinate 25 MG 24 hr tablet Commonly known as: TOPROL-XL Take 25 mg by mouth  2 (two) times daily.   ondansetron 4 MG tablet Commonly known as: Zofran Take 1 tablet (4 mg total) by mouth every 8 (eight) hours as needed for nausea or vomiting.   simvastatin 80 MG tablet Commonly known as: ZOCOR Take 80 mg by mouth in the morning.   Trelegy Ellipta 100-62.5-25 MCG/ACT Aepb Generic drug: Fluticasone-Umeclidin-Vilant Inhale 1 puff into the lungs in the morning.        Diagnostic Studies: DG Shoulder Left Port  Result Date: 11/05/2022 CLINICAL DATA:  Status post ORIF of left humeral fracture EXAM: LEFT SHOULDER COMPARISON:  Intraoperative films from earlier in the same day. FINDINGS: Fixation sideplate and multiple fixation screws are seen. Fracture fragments are in near anatomic alignment. No dislocation is seen. IMPRESSION: Postsurgical changes in the proximal left humerus. Electronically Signed   By: Inez Catalina M.D.   On: 11/05/2022 18:37   DG Humerus Left  Result Date: 11/05/2022 CLINICAL DATA:  Known left humeral fracture EXAM: LEFT HUMERUS - 2+ VIEW COMPARISON:  11/01/2022 FLUOROSCOPY TIME:  Radiation Exposure Index (as provided by the fluoroscopic device): 4.12 mGy If the device does not provide the exposure index: Fluoroscopy Time:  47 seconds Number of Acquired Images:  2 FINDINGS: Fixation sideplate is noted along the proximal humerus with multiple fixation screws. No acute abnormality is noted. IMPRESSION: Status post ORIF of left humeral fracture. Electronically Signed   By: Inez Catalina M.D.   On: 11/05/2022 17:45   DG C-Arm 1-60 Min-No Report  Result Date: 11/05/2022 Fluoroscopy was utilized by the requesting physician.  No radiographic interpretation.   DG C-Arm 1-60 Min-No Report  Result Date: 11/05/2022 Fluoroscopy was utilized by the requesting physician.  No radiographic interpretation.   CT SHOULDER LEFT WO CONTRAST  Result Date: 11/04/2022 CLINICAL DATA:  Fracture, pain EXAM: CT OF THE UPPER LEFT EXTREMITY WITHOUT CONTRAST TECHNIQUE:  Multidetector CT imaging of the upper left extremity was performed according to the standard protocol. RADIATION DOSE REDUCTION: This exam was performed according to the departmental dose-optimization program which includes automated exposure control, adjustment of the mA and/or kV according to patient size and/or use of iterative reconstruction technique. COMPARISON:  Radiograph 10/30/2022 FINDINGS: Bones/Joint/Cartilage There is a comminuted displaced fracture of the proximal humerus through the surgical neck and tuberosity. The greater tuberosity fragment is displaced up to 1.0 cm at the anterior aspect. Fracture extends through the base of the lesser tuberosity into the inferior humeral head articular surface. There is mild underlying glenohumeral osteoarthritis. Moderate AC joint osteoarthritis. Small joint effusion. No glenoid fracture or significant glenoid bone loss. In the proximal humeral diaphysis, 2-3 cm below the surgical neck, there  is a small intramedullary ovoid hyperdensity measuring 0.9 x 0.8 x 1.1 cm (series 5, image 144, series 6, image 58). There is no endosteal scalloping, periostitis, or cortical breakthrough. No prior exam for direct comparison. Prior thoracic vertebroplasties. Ligaments Suboptimally assessed by CT. Muscles and Tendons No significant muscle atrophy. Small to muscular lipoma along the periphery of the mid deltoid measuring up to 1.3 cm (series 3, image 23). Soft tissues Soft tissue swelling along the shoulder. No focal fluid collection. Mild centrilobular emphysema. IMPRESSION: Comminuted and displaced fracture of the proximal humerus through the surgical neck and greater tuberosity. Up to 1.0 cm greater tuberosity displacement. Fracture extension through the base of the lesser tuberosity and to the inferior articular surface of the humeral head. No significant muscle atrophy. No glenoid fracture or significant glenoid bone loss. In the proximal humeral diaphysis, 2-3 cm below  the surgical neck, there is a small intramedullary ovoid hyperdensity measuring 0.9 x 0.8 x 1.1 cm, favored to represent posttraumatic intramedullary hemorrhage, in the absence of a known malignancy. No aggressive features. No prior exam for direct comparison. Electronically Signed   By: Maurine Simmering M.D.   On: 11/04/2022 08:34    Disposition: Discharge disposition: 01-Home or Self Care          Follow-up Information     Marchia Bond, MD. Schedule an appointment as soon as possible for a visit in 2 week(s).   Specialty: Orthopedic Surgery Contact information: [address] [place] [address] [phone]                  Signed: Jola Baptist 11/09/2022, 2:08 PM

## 2022-11-18 DIAGNOSIS — S42292D Other displaced fracture of upper end of left humerus, subsequent encounter for fracture with routine healing: Secondary | ICD-10-CM | POA: Diagnosis not present

## 2022-12-16 DIAGNOSIS — S42292D Other displaced fracture of upper end of left humerus, subsequent encounter for fracture with routine healing: Secondary | ICD-10-CM | POA: Diagnosis not present

## 2022-12-16 DIAGNOSIS — M1612 Unilateral primary osteoarthritis, left hip: Secondary | ICD-10-CM | POA: Diagnosis not present

## 2022-12-17 DIAGNOSIS — R600 Localized edema: Secondary | ICD-10-CM | POA: Diagnosis not present

## 2022-12-17 DIAGNOSIS — I10 Essential (primary) hypertension: Secondary | ICD-10-CM | POA: Diagnosis not present

## 2022-12-17 DIAGNOSIS — Z299 Encounter for prophylactic measures, unspecified: Secondary | ICD-10-CM | POA: Diagnosis not present

## 2022-12-17 DIAGNOSIS — F17210 Nicotine dependence, cigarettes, uncomplicated: Secondary | ICD-10-CM | POA: Diagnosis not present

## 2022-12-21 DIAGNOSIS — M6281 Muscle weakness (generalized): Secondary | ICD-10-CM | POA: Diagnosis not present

## 2022-12-21 DIAGNOSIS — M25512 Pain in left shoulder: Secondary | ICD-10-CM | POA: Diagnosis not present

## 2022-12-21 DIAGNOSIS — Z4789 Encounter for other orthopedic aftercare: Secondary | ICD-10-CM | POA: Diagnosis not present

## 2022-12-21 DIAGNOSIS — Z9181 History of falling: Secondary | ICD-10-CM | POA: Diagnosis not present

## 2022-12-23 DIAGNOSIS — M25512 Pain in left shoulder: Secondary | ICD-10-CM | POA: Diagnosis not present

## 2022-12-23 DIAGNOSIS — Z9181 History of falling: Secondary | ICD-10-CM | POA: Diagnosis not present

## 2022-12-23 DIAGNOSIS — M6281 Muscle weakness (generalized): Secondary | ICD-10-CM | POA: Diagnosis not present

## 2022-12-23 DIAGNOSIS — Z4789 Encounter for other orthopedic aftercare: Secondary | ICD-10-CM | POA: Diagnosis not present

## 2022-12-24 DIAGNOSIS — F17210 Nicotine dependence, cigarettes, uncomplicated: Secondary | ICD-10-CM | POA: Diagnosis not present

## 2022-12-24 DIAGNOSIS — Z299 Encounter for prophylactic measures, unspecified: Secondary | ICD-10-CM | POA: Diagnosis not present

## 2022-12-24 DIAGNOSIS — J449 Chronic obstructive pulmonary disease, unspecified: Secondary | ICD-10-CM | POA: Diagnosis not present

## 2022-12-24 DIAGNOSIS — I10 Essential (primary) hypertension: Secondary | ICD-10-CM | POA: Diagnosis not present

## 2022-12-28 DIAGNOSIS — Z9181 History of falling: Secondary | ICD-10-CM | POA: Diagnosis not present

## 2022-12-28 DIAGNOSIS — M6281 Muscle weakness (generalized): Secondary | ICD-10-CM | POA: Diagnosis not present

## 2022-12-28 DIAGNOSIS — M25512 Pain in left shoulder: Secondary | ICD-10-CM | POA: Diagnosis not present

## 2022-12-28 DIAGNOSIS — Z4789 Encounter for other orthopedic aftercare: Secondary | ICD-10-CM | POA: Diagnosis not present

## 2022-12-30 DIAGNOSIS — Z4789 Encounter for other orthopedic aftercare: Secondary | ICD-10-CM | POA: Diagnosis not present

## 2022-12-30 DIAGNOSIS — M25512 Pain in left shoulder: Secondary | ICD-10-CM | POA: Diagnosis not present

## 2022-12-30 DIAGNOSIS — Z9181 History of falling: Secondary | ICD-10-CM | POA: Diagnosis not present

## 2022-12-30 DIAGNOSIS — M6281 Muscle weakness (generalized): Secondary | ICD-10-CM | POA: Diagnosis not present

## 2023-01-04 DIAGNOSIS — M6281 Muscle weakness (generalized): Secondary | ICD-10-CM | POA: Diagnosis not present

## 2023-01-04 DIAGNOSIS — M25512 Pain in left shoulder: Secondary | ICD-10-CM | POA: Diagnosis not present

## 2023-01-04 DIAGNOSIS — Z9181 History of falling: Secondary | ICD-10-CM | POA: Diagnosis not present

## 2023-01-04 DIAGNOSIS — Z4789 Encounter for other orthopedic aftercare: Secondary | ICD-10-CM | POA: Diagnosis not present

## 2023-01-07 DIAGNOSIS — M6281 Muscle weakness (generalized): Secondary | ICD-10-CM | POA: Diagnosis not present

## 2023-01-07 DIAGNOSIS — Z9181 History of falling: Secondary | ICD-10-CM | POA: Diagnosis not present

## 2023-01-07 DIAGNOSIS — Z4789 Encounter for other orthopedic aftercare: Secondary | ICD-10-CM | POA: Diagnosis not present

## 2023-01-07 DIAGNOSIS — M25512 Pain in left shoulder: Secondary | ICD-10-CM | POA: Diagnosis not present

## 2023-01-11 DIAGNOSIS — M25512 Pain in left shoulder: Secondary | ICD-10-CM | POA: Diagnosis not present

## 2023-01-11 DIAGNOSIS — Z4789 Encounter for other orthopedic aftercare: Secondary | ICD-10-CM | POA: Diagnosis not present

## 2023-01-11 DIAGNOSIS — Z9181 History of falling: Secondary | ICD-10-CM | POA: Diagnosis not present

## 2023-01-11 DIAGNOSIS — M6281 Muscle weakness (generalized): Secondary | ICD-10-CM | POA: Diagnosis not present

## 2023-01-13 DIAGNOSIS — S42292D Other displaced fracture of upper end of left humerus, subsequent encounter for fracture with routine healing: Secondary | ICD-10-CM | POA: Diagnosis not present

## 2023-01-13 DIAGNOSIS — M6281 Muscle weakness (generalized): Secondary | ICD-10-CM | POA: Diagnosis not present

## 2023-01-13 DIAGNOSIS — Z9181 History of falling: Secondary | ICD-10-CM | POA: Diagnosis not present

## 2023-01-13 DIAGNOSIS — M25512 Pain in left shoulder: Secondary | ICD-10-CM | POA: Diagnosis not present

## 2023-01-13 DIAGNOSIS — Z4789 Encounter for other orthopedic aftercare: Secondary | ICD-10-CM | POA: Diagnosis not present

## 2023-01-18 DIAGNOSIS — M6281 Muscle weakness (generalized): Secondary | ICD-10-CM | POA: Diagnosis not present

## 2023-01-18 DIAGNOSIS — Z4789 Encounter for other orthopedic aftercare: Secondary | ICD-10-CM | POA: Diagnosis not present

## 2023-01-18 DIAGNOSIS — M25512 Pain in left shoulder: Secondary | ICD-10-CM | POA: Diagnosis not present

## 2023-01-18 DIAGNOSIS — Z9181 History of falling: Secondary | ICD-10-CM | POA: Diagnosis not present

## 2023-01-20 DIAGNOSIS — M25512 Pain in left shoulder: Secondary | ICD-10-CM | POA: Diagnosis not present

## 2023-01-20 DIAGNOSIS — Z9181 History of falling: Secondary | ICD-10-CM | POA: Diagnosis not present

## 2023-01-20 DIAGNOSIS — M6281 Muscle weakness (generalized): Secondary | ICD-10-CM | POA: Diagnosis not present

## 2023-01-20 DIAGNOSIS — Z4789 Encounter for other orthopedic aftercare: Secondary | ICD-10-CM | POA: Diagnosis not present

## 2023-01-25 DIAGNOSIS — Z9181 History of falling: Secondary | ICD-10-CM | POA: Diagnosis not present

## 2023-01-25 DIAGNOSIS — M6281 Muscle weakness (generalized): Secondary | ICD-10-CM | POA: Diagnosis not present

## 2023-01-25 DIAGNOSIS — M25512 Pain in left shoulder: Secondary | ICD-10-CM | POA: Diagnosis not present

## 2023-01-25 DIAGNOSIS — Z4789 Encounter for other orthopedic aftercare: Secondary | ICD-10-CM | POA: Diagnosis not present

## 2023-02-10 DIAGNOSIS — M25512 Pain in left shoulder: Secondary | ICD-10-CM | POA: Diagnosis not present

## 2023-02-10 DIAGNOSIS — M1612 Unilateral primary osteoarthritis, left hip: Secondary | ICD-10-CM | POA: Diagnosis not present

## 2023-02-11 DIAGNOSIS — I714 Abdominal aortic aneurysm, without rupture, unspecified: Secondary | ICD-10-CM | POA: Diagnosis not present

## 2023-02-11 DIAGNOSIS — Z0000 Encounter for general adult medical examination without abnormal findings: Secondary | ICD-10-CM | POA: Diagnosis not present

## 2023-02-11 DIAGNOSIS — Z7189 Other specified counseling: Secondary | ICD-10-CM | POA: Diagnosis not present

## 2023-02-11 DIAGNOSIS — F17210 Nicotine dependence, cigarettes, uncomplicated: Secondary | ICD-10-CM | POA: Diagnosis not present

## 2023-02-11 DIAGNOSIS — Z1331 Encounter for screening for depression: Secondary | ICD-10-CM | POA: Diagnosis not present

## 2023-02-11 DIAGNOSIS — J449 Chronic obstructive pulmonary disease, unspecified: Secondary | ICD-10-CM | POA: Diagnosis not present

## 2023-02-11 DIAGNOSIS — Z299 Encounter for prophylactic measures, unspecified: Secondary | ICD-10-CM | POA: Diagnosis not present

## 2023-02-11 DIAGNOSIS — I10 Essential (primary) hypertension: Secondary | ICD-10-CM | POA: Diagnosis not present

## 2023-02-11 DIAGNOSIS — Z1339 Encounter for screening examination for other mental health and behavioral disorders: Secondary | ICD-10-CM | POA: Diagnosis not present

## 2023-03-04 DIAGNOSIS — Z299 Encounter for prophylactic measures, unspecified: Secondary | ICD-10-CM | POA: Diagnosis not present

## 2023-03-04 DIAGNOSIS — R600 Localized edema: Secondary | ICD-10-CM | POA: Diagnosis not present

## 2023-03-04 DIAGNOSIS — F17210 Nicotine dependence, cigarettes, uncomplicated: Secondary | ICD-10-CM | POA: Diagnosis not present

## 2023-03-04 DIAGNOSIS — I10 Essential (primary) hypertension: Secondary | ICD-10-CM | POA: Diagnosis not present

## 2023-03-10 DIAGNOSIS — R600 Localized edema: Secondary | ICD-10-CM | POA: Diagnosis not present

## 2023-03-10 DIAGNOSIS — I10 Essential (primary) hypertension: Secondary | ICD-10-CM | POA: Diagnosis not present

## 2023-03-10 DIAGNOSIS — E782 Mixed hyperlipidemia: Secondary | ICD-10-CM | POA: Diagnosis not present

## 2023-03-10 DIAGNOSIS — I7143 Infrarenal abdominal aortic aneurysm, without rupture: Secondary | ICD-10-CM | POA: Diagnosis not present

## 2023-03-10 NOTE — Progress Notes (Signed)
Sent message, via epic in basket, requesting orders in epic from surgeon.  

## 2023-03-15 DIAGNOSIS — M1612 Unilateral primary osteoarthritis, left hip: Secondary | ICD-10-CM | POA: Diagnosis not present

## 2023-03-15 NOTE — Patient Instructions (Signed)
SURGICAL WAITING ROOM VISITATION  Patients having surgery or a procedure may have no more than 2 support people in the waiting area - these visitors may rotate.    Children under the age of 79 must have an adult with them who is not the patient.  Due to an increase in RSV and influenza rates and associated hospitalizations, children ages 80 and under may not visit patients in [facility] hospitals.  If the patient needs to stay at the hospital during part of their recovery, the visitor guidelines for inpatient rooms apply. Pre-op nurse will coordinate an appropriate time for 1 support person to accompany patient in pre-op.  This support person may not rotate.    Please refer to the [facility] website for the visitor guidelines for Inpatients (after your surgery is over and you are in a regular room).    Your procedure is scheduled on: 03/30/23   Report to [facility] Main Entrance    Report to admitting at 5:15 AM   Call this number if you have problems the morning of surgery [phone]   Do not eat food :After Midnight.   After Midnight you may have the following liquids until 4:30 AM DAY OF SURGERY  Water Non-Citrus Juices (without pulp, NO RED-Apple, White grape, White cranberry) Black Coffee (NO MILK/CREAM OR CREAMERS, sugar ok)  Clear Tea (NO MILK/CREAM OR CREAMERS, sugar ok) regular and decaf                             Plain Jell-O (NO RED)                                           Fruit ices (not with fruit pulp, NO RED)                                     Popsicles (NO RED)                                                               Sports drinks like Gatorade (NO RED)                 The day of surgery:  Drink ONE (1) Pre-Surgery Clear Ensure or G2 at AM the morning of surgery. Drink in one sitting. Do not sip.  This drink was given to you during your hospital  pre-op appointment visit. Nothing else to drink after completing the  Pre-Surgery Clear Ensure  or G2.          If you have questions, please contact your surgeon's office.   FOLLOW BOWEL PREP AND ANY ADDITIONAL PRE OP INSTRUCTIONS YOU RECEIVED FROM YOUR SURGEON'S OFFICE!!!     Oral Hygiene is also important to reduce your risk of infection.                                    Remember - BRUSH YOUR TEETH THE MORNING OF SURGERY WITH YOUR REGULAR TOOTHPASTE  DENTURES WILL BE REMOVED PRIOR TO SURGERY PLEASE DO NOT APPLY "Poly grip" OR ADHESIVES!!!   Do NOT smoke after Midnight   Take these medicines the morning of surgery with A SIP OF WATER: Tylenol, Bupropion, Allegra, Levothyroxine, Metoprolol, Rosuvastatin  DO NOT TAKE ANY ORAL DIABETIC MEDICATIONS DAY OF YOUR SURGERY  Bring CPAP mask and tubing day of surgery.                              You may not have any metal on your body including hair pins, jewelry, and body piercing             Do not wear make-up, lotions, powders, perfumes, or deodorant  Do not wear nail polish including gel and S&S, artificial/acrylic nails, or any other type of covering on natural nails including finger and toenails. If you have artificial nails, gel coating, etc. that needs to be removed by a nail salon please have this removed prior to surgery or surgery may need to be canceled/ delayed if the surgeon/ anesthesia feels like they are unable to be safely monitored.   Do not shave  48 hours prior to surgery.    Do not bring valuables to the hospital. [facility] IS NOT             RESPONSIBLE   FOR VALUABLES.   Contacts, glasses, dentures or bridgework may not be worn into surgery.  DO NOT BRING YOUR HOME MEDICATIONS TO THE HOSPITAL. PHARMACY WILL DISPENSE MEDICATIONS LISTED ON YOUR MEDICATION LIST TO YOU DURING YOUR ADMISSION IN THE HOSPITAL!    Patients discharged on the day of surgery will not be allowed to drive home.  Someone NEEDS to stay with you for the first 24 hours after anesthesia.   Special Instructions: Bring a copy of your  healthcare power of attorney and living will documents the day of surgery if you haven't scanned them before.              Please read over the following fact sheets you were given: IF YOU HAVE QUESTIONS ABOUT YOUR PRE-OP INSTRUCTIONS PLEASE CALL [phone] [place]ras   If you received a COVID test during your pre-op visit  it is requested that you wear a mask when out in public, stay away from anyone that may not be feeling well and notify your surgeon if you develop symptoms. If you test positive for Covid or have been in contact with anyone that has tested positive in the last 10 days please notify you surgeon.      Pre-operative 5 CHG Bath Instructions   You can play a key role in reducing the risk of infection after surgery. Your skin needs to be as free of germs as possible. You can reduce the number of germs on your skin by washing with CHG (chlorhexidine gluconate) soap before surgery. CHG is an antiseptic soap that kills germs and continues to kill germs even after washing.   DO NOT use if you have an allergy to chlorhexidine/CHG or antibacterial soaps. If your skin becomes reddened or irritated, stop using the CHG and notify one of our RNs at [phone].   Please shower with the CHG soap starting 4 days before surgery using the following schedule:     Please keep in mind the following:  DO NOT shave, including legs and underarms, starting the day of your first shower.   You may shave your face at  any point before/day of surgery.  Place clean sheets on your bed the day you start using CHG soap. Use a clean washcloth (not used since being washed) for each shower. DO NOT sleep with pets once you start using the CHG.   CHG Shower Instructions:  If you choose to wash your hair and private area, wash first with your normal shampoo/soap.  After you use shampoo/soap, rinse your hair and body thoroughly to remove shampoo/soap residue.  Turn the water OFF and apply about 3 tablespoons  (45 ml) of CHG soap to a CLEAN washcloth.  Apply CHG soap ONLY FROM YOUR NECK DOWN TO YOUR TOES (washing for 3-5 minutes)  DO NOT use CHG soap on face, private areas, open wounds, or sores.  Pay special attention to the area where your surgery is being performed.  If you are having back surgery, having someone wash your back for you may be helpful. Wait 2 minutes after CHG soap is applied, then you may rinse off the CHG soap.  Pat dry with a clean towel  Put on clean clothes/pajamas   If you choose to wear lotion, please use ONLY the CHG-compatible lotions on the back of this paper.     Additional instructions for the day of surgery: DO NOT APPLY any lotions, deodorants, cologne, or perfumes.   Put on clean/comfortable clothes.  Brush your teeth.  Ask your nurse before applying any prescription medications to the skin.      CHG Compatible Lotions   Aveeno Moisturizing lotion  Cetaphil Moisturizing Cream  Cetaphil Moisturizing Lotion  Clairol Herbal Essence Moisturizing Lotion, Dry Skin  Clairol Herbal Essence Moisturizing Lotion, Extra Dry Skin  Clairol Herbal Essence Moisturizing Lotion, Normal Skin  Curel Age Defying Therapeutic Moisturizing Lotion with Alpha Hydroxy  Curel Extreme Care Body Lotion  Curel Soothing Hands Moisturizing Hand Lotion  Curel Therapeutic Moisturizing Cream, Fragrance-Free  Curel Therapeutic Moisturizing Lotion, Fragrance-Free  Curel Therapeutic Moisturizing Lotion, Original Formula  Eucerin Daily Replenishing Lotion  Eucerin Dry Skin Therapy Plus Alpha Hydroxy Crme  Eucerin Dry Skin Therapy Plus Alpha Hydroxy Lotion  Eucerin Original Crme  Eucerin Original Lotion  Eucerin Plus Crme Eucerin Plus Lotion  Eucerin TriLipid Replenishing Lotion  Keri Anti-Bacterial Hand Lotion  Keri Deep Conditioning Original Lotion Dry Skin Formula Softly Scented  Keri Deep Conditioning Original Lotion, Fragrance Free Sensitive Skin Formula  Keri Lotion Fast  Absorbing Fragrance Free Sensitive Skin Formula  Keri Lotion Fast Absorbing Softly Scented Dry Skin Formula  Keri Original Lotion  Keri Skin Renewal Lotion Keri Silky Smooth Lotion  Keri Silky Smooth Sensitive Skin Lotion  Nivea Body Creamy Conditioning Oil  Nivea Body Extra Enriched Teacher, adult education Moisturizing Lotion Nivea Crme  Nivea Skin Firming Lotion  NutraDerm 30 Skin Lotion  NutraDerm Skin Lotion  NutraDerm Therapeutic Skin Cream  NutraDerm Therapeutic Skin Lotion  ProShield Protective Hand Cream  Provon moisturizing lotion

## 2023-03-15 NOTE — Progress Notes (Signed)
Please place orders for PAT appointment scheduled 03/17/23.

## 2023-03-15 NOTE — Progress Notes (Signed)
COVID Vaccine Completed:  Date of COVID positive in last 90 days:  PCP - Doreen Beam, MD Cardiologist - Hubert Azure, MD LOV 03/10/23  Cardiac clearance by Dr Sharrell Ku 03/10/23 in Epic   Chest x-ray -  EKG - 11/05/22 Epic Stress Test -  ECHO -  Cardiac Cath -  Pacemaker/ICD device last checked: Spinal Cord Stimulator:  Bowel Prep -   Sleep Study -  CPAP -   Fasting Blood Sugar -  Checks Blood Sugar _____ times a day  Last dose of GLP1 agonist-  N/A GLP1 instructions:  N/A   Last dose of SGLT-2 inhibitors-  N/A SGLT-2 instructions: N/A   Blood Thinner Instructions:  Time Aspirin Instructions: ASA 81 Last Dose:  Activity level:  Can go up a flight of stairs and perform activities of daily living without stopping and without symptoms of chest pain or shortness of breath.  Able to exercise without symptoms  Unable to go up a flight of stairs without symptoms of     Anesthesia review: AAA, Htn, CAD?, COPD  Patient denies shortness of breath, fever, cough and chest pain at PAT appointment  Patient verbalized understanding of instructions that were given to them at the PAT appointment. Patient was also instructed that they will need to review over the PAT instructions again at home before surgery.

## 2023-03-15 NOTE — Progress Notes (Signed)
Second request for pre op orders in [facility]: Left voicemail for Alfonso Ramus at Dr. Shelba Flake office.

## 2023-03-16 DIAGNOSIS — R600 Localized edema: Secondary | ICD-10-CM | POA: Diagnosis not present

## 2023-03-16 DIAGNOSIS — I10 Essential (primary) hypertension: Secondary | ICD-10-CM | POA: Diagnosis not present

## 2023-03-16 DIAGNOSIS — I517 Cardiomegaly: Secondary | ICD-10-CM | POA: Diagnosis not present

## 2023-03-16 DIAGNOSIS — I080 Rheumatic disorders of both mitral and aortic valves: Secondary | ICD-10-CM | POA: Diagnosis not present

## 2023-03-16 DIAGNOSIS — I340 Nonrheumatic mitral (valve) insufficiency: Secondary | ICD-10-CM | POA: Diagnosis not present

## 2023-03-16 DIAGNOSIS — I5189 Other ill-defined heart diseases: Secondary | ICD-10-CM | POA: Diagnosis not present

## 2023-03-16 DIAGNOSIS — Z01810 Encounter for preprocedural cardiovascular examination: Secondary | ICD-10-CM | POA: Diagnosis not present

## 2023-03-17 ENCOUNTER — Other Ambulatory Visit: Payer: Self-pay

## 2023-03-17 ENCOUNTER — Encounter (HOSPITAL_COMMUNITY): Payer: Self-pay

## 2023-03-17 ENCOUNTER — Encounter (HOSPITAL_COMMUNITY)
Admission: RE | Admit: 2023-03-17 | Discharge: 2023-03-17 | Disposition: A | Discharge status: Home / Self Care | Payer: Medicare HMO | Source: Ambulatory Visit | Attending: Orthopedic Surgery | Admitting: Orthopedic Surgery

## 2023-03-17 VITALS — BP 150/79 | HR 70 | Temp 98.5°F | Resp 16 | Ht 60.0 in

## 2023-03-17 DIAGNOSIS — Z01812 Encounter for preprocedural laboratory examination: Secondary | ICD-10-CM | POA: Insufficient documentation

## 2023-03-17 DIAGNOSIS — F17210 Nicotine dependence, cigarettes, uncomplicated: Secondary | ICD-10-CM | POA: Insufficient documentation

## 2023-03-17 DIAGNOSIS — K219 Gastro-esophageal reflux disease without esophagitis: Secondary | ICD-10-CM | POA: Diagnosis not present

## 2023-03-17 DIAGNOSIS — I10 Essential (primary) hypertension: Secondary | ICD-10-CM | POA: Insufficient documentation

## 2023-03-17 DIAGNOSIS — E039 Hypothyroidism, unspecified: Secondary | ICD-10-CM | POA: Diagnosis not present

## 2023-03-17 DIAGNOSIS — M1612 Unilateral primary osteoarthritis, left hip: Secondary | ICD-10-CM | POA: Diagnosis not present

## 2023-03-17 DIAGNOSIS — H905 Unspecified sensorineural hearing loss: Secondary | ICD-10-CM | POA: Insufficient documentation

## 2023-03-17 DIAGNOSIS — J449 Chronic obstructive pulmonary disease, unspecified: Secondary | ICD-10-CM | POA: Diagnosis not present

## 2023-03-17 DIAGNOSIS — I714 Abdominal aortic aneurysm, without rupture, unspecified: Secondary | ICD-10-CM | POA: Diagnosis not present

## 2023-03-17 DIAGNOSIS — Z01818 Encounter for other preprocedural examination: Secondary | ICD-10-CM

## 2023-03-17 HISTORY — DX: Malignant (primary) neoplasm, unspecified: C80.1

## 2023-03-17 LAB — CBC
HCT: 46.5 % — ABNORMAL HIGH (ref 36.0–46.0)
Hemoglobin: 15.1 g/dL — ABNORMAL HIGH (ref 12.0–15.0)
MCH: 31.6 pg (ref 26.0–34.0)
MCHC: 32.5 g/dL (ref 30.0–36.0)
MCV: 97.3 fL (ref 80.0–100.0)
Platelets: 322 10*3/uL (ref 150–400)
RBC: 4.78 MIL/uL (ref 3.87–5.11)
RDW: 12.8 % (ref 11.5–15.5)
WBC: 11.1 10*3/uL — ABNORMAL HIGH (ref 4.0–10.5)
nRBC: 0 % (ref 0.0–0.2)

## 2023-03-17 LAB — BASIC METABOLIC PANEL
Anion gap: 8 (ref 5–15)
BUN: 19 mg/dL (ref 8–23)
CO2: 24 mmol/L (ref 22–32)
Calcium: 9.3 mg/dL (ref 8.9–10.3)
Chloride: 109 mmol/L (ref 98–111)
Creatinine, Ser: 1.22 mg/dL — ABNORMAL HIGH (ref 0.44–1.00)
GFR, Estimated: 45 mL/min — ABNORMAL LOW (ref >60)
Glucose, Bld: 92 mg/dL (ref 70–99)
Potassium: 3.8 mmol/L (ref 3.5–5.1)
Sodium: 141 mmol/L (ref 135–145)

## 2023-03-17 LAB — SURGICAL PCR SCREEN
MRSA, PCR: NEGATIVE
Staphylococcus aureus: NEGATIVE

## 2023-03-18 ENCOUNTER — Encounter (HOSPITAL_COMMUNITY): Payer: Self-pay

## 2023-03-18 NOTE — Progress Notes (Signed)
Case: [id number] Date/Time: 03/30/23 0715   Procedure: TOTAL HIP ARTHROPLASTY (Left: Hip)   Anesthesia type: Spinal   Pre-op diagnosis: OA LEFT HIP   Location: [facility] ROOM 07 / [facility]   Surgeons: Teryl Lucy, MD       DISCUSSION: Tonya Murphy is a 82 yo every day smoker who presents to PAT prior to surgery listed above. PMH significant for every day smoking, COPD, HTN, AAA, hypothyroidism, GERD, hearing loss, spinal surgery (kyphoplasty T7 04/2016)   No prior anesthesia complications. Had ORIF of the left proximal humerus on 11/05/22 and did well.  Last saw Cardiology on 03/10/23. BP was not well controlled due to Amlodipine being discontinued due to LE edema. Losartan was started. BP still elevated at PAT visit but would not preclude her from having surgery. On Lasix PRN. Unclear if she has been taking it daily as her SCr is elevated on PAT labs (1.2 up from 0.7 in Feb 2024). Also takes Aleve for back pain as well. Per Dr. Sharrell Ku at the 6/5 visit:  "Preoperative cardiovascular examination - She has upcoming orthopaedic surgery (elective hip arthroplasty) - She has AAA and likely CAD as well - She reports no major symptoms and probably able to perform 4 METs of activity (limited by hip pain) - She had orthopaedic surgery 11/2022 and did well - She does not wish for stress testing, will obtain TTE to assess cardiac structure and function with recommendations to follow - Recommend to continue BB therapy  - Regarding Echo results: "Function is WNL. Moderate risk for proposed surgery. Recommend to continue BB therapy perioperatively without interruption, as well as pre and post procedural EKG.  AAA being followed by PCP/Vascular.  Measuring 3.6 cm 02/26/22 on ultrasound.   Patient follows with PCP for her other chronic medical conditions. Last seen on 02/11/23. Clearance provided on 02/11/23 that patient is medically optimized and low risk for surgery with instructions: "No ASA 1 week prior to surgery,  continue inhalers peri-operatively, take metoprolol DOS".  VS: BP (!) 150/79   Pulse 70   Temp 36.9 C (Oral)   Resp 16   Ht 5' (1.524 m)   SpO2 94%   BMI 25.97 kg/m   PROVIDERS: Ignatius Specking, MD Cardiology: Hubert Azure, MD  LABS: Labs reviewed: Acceptable for surgery. Mild AKI noted. Routed to [facility]. Likely from Lasix and NSAID use (all labs ordered are listed, but only abnormal results are displayed)  Labs Reviewed  BASIC METABOLIC PANEL - Abnormal; Notable for the following components:      Result Value   Creatinine, Ser 1.22 (*)    GFR, Estimated 45 (*)    All other components within normal limits  CBC - Abnormal; Notable for the following components:   WBC 11.1 (*)    Hemoglobin 15.1 (*)    HCT 46.5 (*)    All other components within normal limits  SURGICAL PCR SCREEN     IMAGES: n/a   EKG 03/10/23 (paper copy in chart):  SR, poor R wave progression, 60 bpm    CV:  Echo 03/16/23 [facility])  Summary   1. The left ventricle is normal in size with normal wall thickness.    2. The left ventricular systolic function is normal, LVEF is visually  estimated at > 55%.    3. There is grade I diastolic dysfunction (impaired relaxation).    4. There is mild mitral valve regurgitation.    5. The aortic valve is trileaflet with mildly  thickened leaflets with normal  excursion.   6. The left atrium is mildly dilated in size.    7. The right ventricle is normal in size, with normal systolic function.   [place] Abd Aorta 02/26/22 [facility]): FINDINGS:  Abdominal aortic measurements as follows:  Proximal:  3.4 cm  Mid:  3.6 cm  Distal:  1.9 cm  Right iliac artery: 1.6 cm  Left iliac artery: 1.2 cm  IMPRESSION: Recommend follow-up every 2 years. This recommendation follows ACR  consensus guidelines: White Paper of the ACR Incidental Findings  Committee II on Vascular Findings. J Am Coll Radiol 2013;  10:789-794. Aortic aneurysm NOS (ICD10-I71.9).   Past Medical History:   Diagnosis Date   Abdominal aortic aneurysm (AAA) (HCC) 02/26/2022   in [place]   Arthritis    Cancer (HCC)    basal   Compression fracture of body of thoracic vertebra (HCC)    COPD (chronic obstructive pulmonary disease) (HCC) with exertion   GERD (gastroesophageal reflux disease)    no current problems as of 11/04/22   Hyperlipidemia    Hypertension    Hypothyroidism    Osteoporosis    Sensory hearing loss 02/21/2020   bilateral - in [place], no hearing aids    Past Surgical History:  Procedure Laterality Date   BACK SURGERY  2017, 2019   COMPRESSION FRACTURES   BREAST BIOPSY Left 2019   [facility] Benign   COLONOSCOPY     EYE SURGERY  lasik ou   2001   EYE SURGERY  cataract ou   2010   ORIF HUMERUS FRACTURE Left 11/05/2022   Procedure: OPEN REDUCTION INTERNAL FIXATION (ORIF) PROXIMAL HUMERUS FRACTURE;  Surgeon: Teryl Lucy, MD;  Location: [facility] OR;  Service: Orthopedics;  Laterality: Left;    MEDICATIONS:  acetaminophen (TYLENOL) 500 MG tablet   aspirin EC 81 MG tablet   BIOTIN PO   buPROPion (WELLBUTRIN SR) 150 MG 12 hr tablet   Calcium Carb-Cholecalciferol (CALCIUM 600 + D PO)   fexofenadine (ALLEGRA) 180 MG tablet   furosemide (LASIX) 20 MG tablet   levothyroxine (SYNTHROID) 50 MCG tablet   losartan (COZAAR) 25 MG tablet   Melatonin 10 MG CAPS   metoprolol succinate (TOPROL-XL) 25 MG 24 hr tablet   potassium chloride (KLOR-CON) 10 MEQ tablet   rosuvastatin (CRESTOR) 40 MG tablet   TRELEGY ELLIPTA 100-62.5-25 MCG/ACT AEPB   No current facility-administered medications for this encounter.   [name] [facility] Surgical Short Stay/Anesthesiology [facility] Phone [phone] 03/19/2023 8:55 AM

## 2023-03-19 NOTE — Anesthesia Preprocedure Evaluation (Addendum)
Anesthesia Evaluation  Patient identified by MRN, date of birth, ID band Patient awake    Reviewed: Allergy & Precautions, NPO status , Patient's Chart, lab work & pertinent test results  Airway Mallampati: II  TM Distance: >3 FB Neck ROM: Full    Dental no notable dental hx.    Pulmonary COPD, Current Smoker and Patient abstained from smoking.   Pulmonary exam normal        Cardiovascular hypertension, Pt. on medications and Pt. on home beta blockers + Peripheral Vascular Disease   Rhythm:Regular Rate:Normal     Neuro/Psych negative neurological ROS  negative psych ROS   GI/Hepatic Neg liver ROS,GERD  ,,  Endo/Other  Hypothyroidism    Renal/GU negative Renal ROS  negative genitourinary   Musculoskeletal  (+) Arthritis , Osteoarthritis,    Abdominal Normal abdominal exam  (+)   Peds  Hematology Lab Results      Component                Value               Date                      WBC                      11.1 (H)            03/17/2023                HGB                      15.1 (H)            03/17/2023                HCT                      46.5 (H)            03/17/2023                MCV                      97.3                03/17/2023                PLT                      322                 03/17/2023             Lab Results      Component                Value               Date                      NA                       141                 03/17/2023                K  3.8                 03/17/2023                CO2                      24                  03/17/2023                GLUCOSE                  92                  03/17/2023                BUN                      19                  03/17/2023                CREATININE               1.22 (H)            03/17/2023                CALCIUM                  9.3                 03/17/2023                GFRNONAA                  45 (L)              03/17/2023              Anesthesia Other Findings   Reproductive/Obstetrics                             Anesthesia Physical Anesthesia Plan  ASA: 2  Anesthesia Plan: MAC and Spinal   Post-op Pain Management:    Induction: Intravenous  PONV Risk Score and Plan: 1 and Ondansetron, Dexamethasone, Propofol infusion and Treatment may vary due to age or medical condition  Airway Management Planned: Simple Face Mask and Nasal Cannula  Additional Equipment: None  Intra-op Plan:   Post-operative Plan:   Informed Consent: I have reviewed the patients History and Physical, chart, labs and discussed the procedure including the risks, benefits and alternatives for the proposed anesthesia with the patient or authorized representative who has indicated his/her understanding and acceptance.     Dental advisory given  Plan Discussed with: CRNA  Anesthesia Plan Comments: (See PAT note from 6/12 by Sherlie Ban PA-C )        Anesthesia Quick Evaluation

## 2023-03-24 NOTE — Care Plan (Signed)
Ortho Bundle Case Management Note  Patient Details  Name: Tonya Murphy MRN: [id number] Date of Birth: 1941-06-11     patient and daughter seen in the office for H&P. will discharge to home with daughter to assist. has equipment at home. OPPT set up with [facility]. discharge instructions discussed and questions answered. appointments confirmed                DME Arranged:    DME Agency:     HH Arranged:    HH Agency:     Additional Comments: Please contact me with any questions of if this plan should need to change.  Shauna Hugh,  RN,BSN,MHA,CCM  [facility] Orthopaedic Specialist  [phone] 03/24/2023, 7:26 PM

## 2023-03-29 NOTE — H&P (Signed)
HIP ARTHROPLASTY ADMISSION H&P  Patient ID: Tonya Murphy MRN: [id number] DOB/AGE: 1941/01/08 82 y.o.  Chief Complaint: left hip pain.  Planned Procedure Date: 03/30/23 Medical Clearance by Dr. Sherril Croon  HPI: Tonya Murphy is a 82 y.o. female who presents for evaluation of OA LEFT HIP. The patient has a history of pain and functional disability in the left hip due to arthritis and has failed non-surgical conservative treatments for greater than 12 weeks to include use of assistive devices and activity modification.  Onset of symptoms was abrupt, starting 6 months ago with rapidlly worsening course since that time. The patient noted no past surgery on the left hip.  Patient currently rates pain at moderate with activity. Patient has worsening of pain with activity and weight bearing, pain that interferes with activities of daily living, and pain with passive range of motion.  Patient has evidence of joint space narrowing by imaging studies.  There is no active infection.  Past Medical History:  Diagnosis Date   Abdominal aortic aneurysm (AAA) (HCC) 02/26/2022   in [place]   Arthritis    Cancer (HCC)    basal   Compression fracture of body of thoracic vertebra (HCC)    COPD (chronic obstructive pulmonary disease) (HCC) with exertion   GERD (gastroesophageal reflux disease)    no current problems as of 11/04/22   Hyperlipidemia    Hypertension    Hypothyroidism    Osteoporosis    Sensory hearing loss 02/21/2020   bilateral - in [place], no hearing aids   Past Surgical History:  Procedure Laterality Date   BACK SURGERY  2017, 2019   COMPRESSION FRACTURES   BREAST BIOPSY Left 2019   [facility] Benign   COLONOSCOPY     EYE SURGERY  lasik ou   2001   EYE SURGERY  cataract ou   2010   ORIF HUMERUS FRACTURE Left 11/05/2022   Procedure: OPEN REDUCTION INTERNAL FIXATION (ORIF) PROXIMAL HUMERUS FRACTURE;  Surgeon: Teryl Lucy, MD;  Location: [facility];  Service: Orthopedics;  Laterality:  Left;   Allergies  Allergen Reactions   Actonel [Risedronate] Other (See Comments)    Gums swell and made teeth loose   Oxycodone Other (See Comments)    Makes patient emotional   Prior to Admission medications   Medication Sig Start Date End Date Taking? Authorizing Provider  acetaminophen (TYLENOL) 500 MG tablet Take 1,000 mg by mouth every 6 (six) hours as needed for moderate pain.   Yes [provider]  aspirin EC 81 MG tablet Take 81 mg by mouth in the morning.   Yes [provider]  BIOTIN PO Take 1 tablet by mouth in the morning.   Yes [provider]  buPROPion (WELLBUTRIN SR) 150 MG 12 hr tablet Take 150 mg by mouth in the morning.   Yes [provider]  Calcium Carb-Cholecalciferol (CALCIUM 600 + D PO) Take 1 tablet by mouth daily.   Yes [provider]  fexofenadine (ALLEGRA) 180 MG tablet Take 180 mg by mouth daily as needed for allergies or rhinitis.   Yes [provider]  furosemide (LASIX) 20 MG tablet Take 20 mg by mouth daily as needed for edema. 03/10/23  Yes [provider]  levothyroxine (SYNTHROID) 50 MCG tablet Take 50 mcg by mouth daily before breakfast.   Yes [provider]  losartan (COZAAR) 25 MG tablet Take 25 mg by mouth daily. 03/10/23  Yes [provider]  Melatonin 10 MG CAPS  Take 10 mg by mouth at bedtime.   Yes [provider]  metoprolol succinate (TOPROL-XL) 25 MG 24 hr tablet Take 25 mg by mouth 2 (two) times daily.   Yes [provider]  potassium chloride (KLOR-CON) 10 MEQ tablet Take 10 mEq by mouth See admin instructions. Take 10 meq every other day as needed for swelling 02/11/23  Yes [provider]  rosuvastatin (CRESTOR) 40 MG tablet Take 40 mg by mouth daily. 03/10/23  Yes [provider]  TRELEGY ELLIPTA 100-62.5-25 MCG/ACT AEPB Inhale 1 puff into the lungs in the morning.   Yes [provider]   Social History   Socioeconomic  History   Marital status: Married    Spouse name: Not on file   Number of children: Not on file   Years of education: Not on file   Highest education level: Not on file  Occupational History   Not on file  Tobacco Use   Smoking status: Every Day    Packs/day: .5    Types: Cigarettes   Smokeless tobacco: Never   Tobacco comments:    Cut back to 1/4 ppd   Vaping Use   Vaping Use: Every day   Substances: Nicotine  Substance and Sexual Activity   Alcohol use: Not Currently    Comment: occasional liquor   Drug use: No   Sexual activity: Not Currently    Birth control/protection: Post-menopausal  Other Topics Concern   Not on file  Social History Narrative   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   DOES INCOME TAX ON THE SIDE.   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 IN [place]   HOBBIES: READS, CLEANING HER HOUSE   Social Determinants of Health   Financial Resource Strain: Not on file  Food Insecurity: Not on file  Transportation Needs: Not on file  Physical Activity: Not on file  Stress: Not on file  Social Connections: Not on file   Family History  Problem Relation Age of Onset   Crohn's disease Grandchild    Breast cancer Sister    Colon cancer Neg Hx    Colon polyps Neg Hx    Celiac disease Neg Hx     ROS: Currently denies lightheadedness, dizziness, Fever, chills, CP, SOB.   No personal history of DVT, PE, MI, or CVA. No loose teeth or dentures All other systems have been reviewed and were otherwise currently negative with the exception of those mentioned in the HPI and as above.  Objective: Vitals: HT: 5' 0  WT: 130  T: 9797  BP 179/75  HR: 70  O2 SAT:  91% on room air.  Physical Exam: General: Alert, NAD. Trendelenberg Gait  HEENT: EOMI, Good Neck Extension  Pulm: No increased work of breathing.  Clear B/L A/P w/o crackle or wheeze.  CV: RRR, No m/g/r appreciated  GI: soft, NT, ND Neuro: Neuro without gross focal deficit.  Sensation intact  distally Skin: No lesions in the area of chief complaint MSK/Surgical Site: She has 0-80  degrees range of forward flexion left hip. Pain elicited with passive internal and external rotation of the hip. EHL and FHL intact. She walks with an antalgic gait. Distal sensation intact.  2+  PT pulse.    Imaging Review Plain radiographs demonstrate severe degenerative joint disease of the left hip.   The bone quality appears to be adequate for age and reported activity level.  Preoperative templating of the joint replacement has been completed, documented, and submitted to  the Operating Room personnel in order to optimize intra-operative equipment management.  Assessment: OA LEFT HIP Active Problems:   * No active hospital problems. *   Plan: Plan for Procedure(s): TOTAL HIP ARTHROPLASTY  The patient history, physical exam, clinical judgement of the provider and imaging are consistent with end stage degenerative joint disease and total joint arthroplasty is deemed medically necessary. The treatment options including medical management, injection therapy, and arthroplasty were discussed at length. The risks and benefits of Procedure(s): TOTAL HIP ARTHROPLASTY were presented and reviewed.  The risks of nonoperative treatment, versus surgical intervention including but not limited to continued pain, aseptic loosening, stiffness, dislocation/subluxation, infection, bleeding, nerve injury, blood clots, cardiopulmonary complications, morbidity, mortality, among others were discussed. The patient verbalizes understanding and wishes to proceed with the plan.  Patient is being admitted for surgery, pain control, PT, prophylactic antibiotics, VTE prophylaxis, progressive ambulation, ADL's and discharge planning.   The patient does meet the criteria for TXA which will be used perioperatively.   ASA 325 mg will be used postoperatively for DVT prophylaxis in addition to SCDs, and early  ambulation.    Armida Sans, PA-C 03/29/2023 12:27 PM

## 2023-03-30 ENCOUNTER — Ambulatory Visit (HOSPITAL_BASED_OUTPATIENT_CLINIC_OR_DEPARTMENT_OTHER): Payer: Medicare HMO | Admitting: Anesthesiology

## 2023-03-30 ENCOUNTER — Other Ambulatory Visit: Payer: Self-pay

## 2023-03-30 ENCOUNTER — Encounter (HOSPITAL_COMMUNITY)
Admission: RE | Disposition: A | Discharge status: Home / Self Care | Payer: Self-pay | Source: Home / Self Care | Attending: Orthopedic Surgery

## 2023-03-30 ENCOUNTER — Ambulatory Visit (HOSPITAL_COMMUNITY)
Admission: RE | Admit: 2023-03-30 | Discharge: 2023-03-30 | Disposition: A | Discharge status: Home / Self Care | Payer: Medicare HMO | Attending: Orthopedic Surgery | Admitting: Orthopedic Surgery

## 2023-03-30 ENCOUNTER — Ambulatory Visit (HOSPITAL_COMMUNITY): Payer: Medicare HMO

## 2023-03-30 ENCOUNTER — Encounter (HOSPITAL_COMMUNITY): Payer: Self-pay | Admitting: Orthopedic Surgery

## 2023-03-30 ENCOUNTER — Ambulatory Visit (HOSPITAL_COMMUNITY): Payer: Medicare HMO | Admitting: Medical

## 2023-03-30 DIAGNOSIS — K219 Gastro-esophageal reflux disease without esophagitis: Secondary | ICD-10-CM | POA: Diagnosis not present

## 2023-03-30 DIAGNOSIS — I10 Essential (primary) hypertension: Secondary | ICD-10-CM | POA: Insufficient documentation

## 2023-03-30 DIAGNOSIS — F17210 Nicotine dependence, cigarettes, uncomplicated: Secondary | ICD-10-CM | POA: Insufficient documentation

## 2023-03-30 DIAGNOSIS — M87051 Idiopathic aseptic necrosis of right femur: Secondary | ICD-10-CM | POA: Diagnosis not present

## 2023-03-30 DIAGNOSIS — E039 Hypothyroidism, unspecified: Secondary | ICD-10-CM | POA: Diagnosis not present

## 2023-03-30 DIAGNOSIS — M879 Osteonecrosis, unspecified: Secondary | ICD-10-CM | POA: Diagnosis not present

## 2023-03-30 DIAGNOSIS — J449 Chronic obstructive pulmonary disease, unspecified: Secondary | ICD-10-CM | POA: Diagnosis not present

## 2023-03-30 DIAGNOSIS — M1612 Unilateral primary osteoarthritis, left hip: Secondary | ICD-10-CM | POA: Insufficient documentation

## 2023-03-30 DIAGNOSIS — Z96642 Presence of left artificial hip joint: Secondary | ICD-10-CM | POA: Diagnosis not present

## 2023-03-30 DIAGNOSIS — Z471 Aftercare following joint replacement surgery: Secondary | ICD-10-CM | POA: Diagnosis not present

## 2023-03-30 HISTORY — PX: TOTAL HIP ARTHROPLASTY: SHX124

## 2023-03-30 SURGERY — ARTHROPLASTY, HIP, TOTAL,POSTERIOR APPROACH
Anesthesia: Monitor Anesthesia Care | Site: Hip | Laterality: Left

## 2023-03-30 MED ORDER — POVIDONE-IODINE 10 % EX SWAB: 2.0000 | Freq: Once | CUTANEOUS | Status: DC

## 2023-03-30 MED ORDER — PHENYLEPHRINE HCL-NACL 20-0.9 MG/250ML-% IV SOLN
INTRAVENOUS | Status: DC | PRN
  Administered 2023-03-30: 40 ug/min via INTRAVENOUS

## 2023-03-30 MED ORDER — LIDOCAINE HCL (PF) 2 % IJ SOLN
INTRAMUSCULAR | Status: DC | PRN
  Administered 2023-03-30: 60 mg via INTRADERMAL

## 2023-03-30 MED ORDER — DEXAMETHASONE SODIUM PHOSPHATE 10 MG/ML IJ SOLN
INTRAMUSCULAR | Status: AC
  Filled 2023-03-30: qty 1

## 2023-03-30 MED ORDER — ONDANSETRON HCL 4 MG/2ML IJ SOLN
INTRAMUSCULAR | Status: AC
  Filled 2023-03-30: qty 2

## 2023-03-30 MED ORDER — EPHEDRINE 5 MG/ML INJ
INTRAVENOUS | Status: AC
  Filled 2023-03-30: qty 5

## 2023-03-30 MED ORDER — HYDROCODONE-ACETAMINOPHEN 10-325 MG PO TABS: 1.0000 | ORAL_TABLET | Freq: Four times a day (QID) | ORAL | 0 refills | Status: DC | PRN

## 2023-03-30 MED ORDER — FENTANYL CITRATE PF 50 MCG/ML IJ SOSY: 25.0000 ug | PREFILLED_SYRINGE | INTRAMUSCULAR | Status: DC | PRN

## 2023-03-30 MED ORDER — LACTATED RINGERS IV BOLUS
250.0000 mL | Freq: Once | INTRAVENOUS | Status: AC
  Administered 2023-03-30: 250 mL via INTRAVENOUS

## 2023-03-30 MED ORDER — ACETAMINOPHEN 500 MG PO TABS
1000.0000 mg | ORAL_TABLET | Freq: Once | ORAL | Status: AC
  Administered 2023-03-30: 1000 mg via ORAL
  Filled 2023-03-30: qty 2

## 2023-03-30 MED ORDER — CEFAZOLIN SODIUM-DEXTROSE 2-4 GM/100ML-% IV SOLN
2.0000 g | INTRAVENOUS | Status: AC
  Administered 2023-03-30: 2 g via INTRAVENOUS
  Filled 2023-03-30: qty 100

## 2023-03-30 MED ORDER — TRANEXAMIC ACID-NACL 1000-0.7 MG/100ML-% IV SOLN
INTRAVENOUS | Status: AC
  Administered 2023-03-30: 1000 mg via INTRAVENOUS
  Filled 2023-03-30: qty 100

## 2023-03-30 MED ORDER — ASPIRIN 325 MG PO TBEC: 325.0000 mg | DELAYED_RELEASE_TABLET | Freq: Two times a day (BID) | ORAL | 0 refills | Status: AC

## 2023-03-30 MED ORDER — AMISULPRIDE (ANTIEMETIC) 5 MG/2ML IV SOLN: 10.0000 mg | Freq: Once | INTRAVENOUS | Status: DC | PRN

## 2023-03-30 MED ORDER — STERILE WATER FOR IRRIGATION IR SOLN
Status: DC | PRN
  Administered 2023-03-30: 2000 mL

## 2023-03-30 MED ORDER — ORAL CARE MOUTH RINSE: 15.0000 mL | Freq: Once | OROMUCOSAL | Status: AC

## 2023-03-30 MED ORDER — 0.9 % SODIUM CHLORIDE (POUR BTL) OPTIME
TOPICAL | Status: DC | PRN
  Administered 2023-03-30: 1000 mL

## 2023-03-30 MED ORDER — LACTATED RINGERS IV BOLUS: 250.0000 mL | Freq: Once | INTRAVENOUS | Status: DC

## 2023-03-30 MED ORDER — HYDROCODONE-ACETAMINOPHEN 10-325 MG PO TABS: 1.0000 | ORAL_TABLET | Freq: Four times a day (QID) | ORAL | 0 refills | Status: AC | PRN

## 2023-03-30 MED ORDER — DEXAMETHASONE SODIUM PHOSPHATE 10 MG/ML IJ SOLN
INTRAMUSCULAR | Status: DC | PRN
  Administered 2023-03-30: 5 mg via INTRAVENOUS

## 2023-03-30 MED ORDER — PROPOFOL 500 MG/50ML IV EMUL
INTRAVENOUS | Status: DC | PRN
  Administered 2023-03-30: 60 mg via INTRAVENOUS
  Administered 2023-03-30: 40 mg via INTRAVENOUS

## 2023-03-30 MED ORDER — HYDROCODONE-ACETAMINOPHEN 5-325 MG PO TABS
ORAL_TABLET | ORAL | Status: AC
  Filled 2023-03-30: qty 1

## 2023-03-30 MED ORDER — BUPIVACAINE HCL (PF) 0.25 % IJ SOLN
INTRAMUSCULAR | Status: DC | PRN
  Administered 2023-03-30: 30 mL

## 2023-03-30 MED ORDER — CHLORHEXIDINE GLUCONATE 0.12 % MT SOLN
15.0000 mL | Freq: Once | OROMUCOSAL | Status: AC
  Administered 2023-03-30: 15 mL via OROMUCOSAL

## 2023-03-30 MED ORDER — CEFAZOLIN SODIUM-DEXTROSE 1-4 GM/50ML-% IV SOLN
INTRAVENOUS | Status: AC
  Filled 2023-03-30: qty 50

## 2023-03-30 MED ORDER — CEFAZOLIN SODIUM-DEXTROSE 2-4 GM/100ML-% IV SOLN
INTRAVENOUS | Status: AC
  Filled 2023-03-30: qty 100

## 2023-03-30 MED ORDER — KETOROLAC TROMETHAMINE 30 MG/ML IJ SOLN
INTRAMUSCULAR | Status: DC | PRN
  Administered 2023-03-30: 30 mg

## 2023-03-30 MED ORDER — TRANEXAMIC ACID-NACL 1000-0.7 MG/100ML-% IV SOLN: 1000.0000 mg | Freq: Once | INTRAVENOUS | Status: AC

## 2023-03-30 MED ORDER — KETOROLAC TROMETHAMINE 30 MG/ML IJ SOLN
INTRAMUSCULAR | Status: AC
  Filled 2023-03-30: qty 1

## 2023-03-30 MED ORDER — EPHEDRINE SULFATE (PRESSORS) 50 MG/ML IJ SOLN
INTRAMUSCULAR | Status: DC | PRN
  Administered 2023-03-30 (×2): 5 mg via INTRAVENOUS
  Administered 2023-03-30: 10 mg via INTRAVENOUS

## 2023-03-30 MED ORDER — BUPIVACAINE IN DEXTROSE 0.75-8.25 % IT SOLN
INTRATHECAL | Status: DC | PRN
  Administered 2023-03-30: 1.6 mL via INTRATHECAL

## 2023-03-30 MED ORDER — ONDANSETRON HCL 4 MG/2ML IJ SOLN
INTRAMUSCULAR | Status: DC | PRN
  Administered 2023-03-30: 4 mg via INTRAVENOUS

## 2023-03-30 MED ORDER — CEFAZOLIN SODIUM-DEXTROSE 1-4 GM/50ML-% IV SOLN
1.0000 g | Freq: Four times a day (QID) | INTRAVENOUS | Status: DC
  Administered 2023-03-30: 1 g via INTRAVENOUS

## 2023-03-30 MED ORDER — LACTATED RINGERS IV SOLN: INTRAVENOUS | Status: DC

## 2023-03-30 MED ORDER — HYDROCODONE-ACETAMINOPHEN 5-325 MG PO TABS
1.0000 | ORAL_TABLET | ORAL | Status: DC | PRN
  Administered 2023-03-30: 1 via ORAL

## 2023-03-30 MED ORDER — PHENYLEPHRINE HCL (PRESSORS) 10 MG/ML IV SOLN
INTRAVENOUS | Status: AC
  Filled 2023-03-30: qty 1

## 2023-03-30 MED ORDER — ASPIRIN 325 MG PO TBEC: 325.0000 mg | DELAYED_RELEASE_TABLET | Freq: Two times a day (BID) | ORAL | 0 refills | Status: DC

## 2023-03-30 MED ORDER — BUPIVACAINE HCL (PF) 0.25 % IJ SOLN
INTRAMUSCULAR | Status: AC
  Filled 2023-03-30: qty 30

## 2023-03-30 MED ORDER — PROPOFOL 10 MG/ML IV BOLUS
INTRAVENOUS | Status: DC | PRN
  Administered 2023-03-30: 70 ug/kg/min via INTRAVENOUS

## 2023-03-30 MED ORDER — POVIDONE-IODINE 7.5 % EX SOLN: Freq: Once | CUTANEOUS | Status: DC

## 2023-03-30 MED ORDER — PROPOFOL 1000 MG/100ML IV EMUL
INTRAVENOUS | Status: AC
  Filled 2023-03-30: qty 100

## 2023-03-30 MED ORDER — LACTATED RINGERS IV BOLUS
500.0000 mL | Freq: Once | INTRAVENOUS | Status: AC
  Administered 2023-03-30: 500 mL via INTRAVENOUS

## 2023-03-30 MED ORDER — LIDOCAINE HCL (PF) 2 % IJ SOLN
INTRAMUSCULAR | Status: AC
  Filled 2023-03-30: qty 5

## 2023-03-30 MED ORDER — TRANEXAMIC ACID-NACL 1000-0.7 MG/100ML-% IV SOLN
1000.0000 mg | INTRAVENOUS | Status: AC
  Administered 2023-03-30: 1000 mg via INTRAVENOUS
  Filled 2023-03-30: qty 100

## 2023-03-30 SURGICAL SUPPLY — 59 items
BAG COUNTER SPONGE SURGICOUNT (BAG) IMPLANT
BAG SPNG CNTER NS LX DISP (BAG)
BIT DRILL 2.0X128 (BIT) ×1 IMPLANT
BLADE SAW SGTL 73X25 THK (BLADE) ×1 IMPLANT
CLSR STERI-STRIP ANTIMIC 1/2X4 (GAUZE/BANDAGES/DRESSINGS) ×2 IMPLANT
COVER SURGICAL LIGHT HANDLE (MISCELLANEOUS) ×1 IMPLANT
CUP SECTOR GRIPTON 50MM (Cup) IMPLANT
DRAPE INCISE IOBAN 66X45 STRL (DRAPES) ×1 IMPLANT
DRAPE ORTHO SPLIT 77X108 STRL (DRAPES) ×2
DRAPE POUCH INSTRU U-SHP 10X18 (DRAPES) ×1 IMPLANT
DRAPE SHEET LG 3/4 BI-LAMINATE (DRAPES) ×1 IMPLANT
DRAPE SURG 17X11 SM STRL (DRAPES) ×1 IMPLANT
DRAPE SURG ORHT 6 SPLT 77X108 (DRAPES) ×2 IMPLANT
DRAPE U-SHAPE 47X51 STRL (DRAPES) ×1 IMPLANT
DRSG MEPILEX POST OP 4X12 (GAUZE/BANDAGES/DRESSINGS) IMPLANT
DRSG MEPILEX POST OP 4X8 (GAUZE/BANDAGES/DRESSINGS) ×1 IMPLANT
DURAPREP 26ML APPLICATOR (WOUND CARE) ×2 IMPLANT
ELECT BLADE TIP CTD 4 INCH (ELECTRODE) ×1 IMPLANT
ELECT REM PT RETURN 15FT ADLT (MISCELLANEOUS) ×1 IMPLANT
ELIMINATOR HOLE APEX DEPUY (Hips) IMPLANT
FACESHIELD WRAPAROUND (MASK) ×2 IMPLANT
FACESHIELD WRAPAROUND OR TEAM (MASK) ×2 IMPLANT
GLOVE BIO SURGEON STRL SZ 6.5 (GLOVE) ×1 IMPLANT
GLOVE BIO SURGEON STRL SZ7.5 (GLOVE) ×1 IMPLANT
GLOVE BIOGEL PI IND STRL 7.0 (GLOVE) ×1 IMPLANT
GLOVE BIOGEL PI IND STRL 8 (GLOVE) ×1 IMPLANT
GOWN STRL SURGICAL XL XLNG (GOWN DISPOSABLE) ×2 IMPLANT
HEAD FEM STD 32X+1 STRL (Hips) IMPLANT
HOOD PEEL AWAY T7 (MISCELLANEOUS) ×3 IMPLANT
K-WIRE TROCAR PT 2.0 150MM (WIRE) ×1
KIT BASIN OR (CUSTOM PROCEDURE TRAY) ×1 IMPLANT
KIT TURNOVER KIT A (KITS) IMPLANT
KWIRE TROCAR PT 2.0 150 (WIRE) ×1 IMPLANT
KWIRE TROCAR PT 2.0 150MM (WIRE) ×1 IMPLANT
LINER ACET PNNCL PLUS4 NEUTRAL (Hips) IMPLANT
MANIFOLD NEPTUNE II (INSTRUMENTS) ×1 IMPLANT
NDL MA TROC 1/2 (NEEDLE) IMPLANT
NDL SAFETY ECLIP 18X1.5 (MISCELLANEOUS) ×2 IMPLANT
NEEDLE ANCHOR KEITH 2 7/8 STR (NEEDLE) ×1 IMPLANT
NEEDLE MA TROC 1/2 (NEEDLE) IMPLANT
NS IRRIG 1000ML POUR BTL (IV SOLUTION) ×1 IMPLANT
PACK TOTAL JOINT (CUSTOM PROCEDURE TRAY) ×1 IMPLANT
PINNACLE PLUS 4 NEUTRAL (Hips) ×1 IMPLANT
PROTECTOR NERVE ULNAR (MISCELLANEOUS) ×1 IMPLANT
SCREW 6.5MMX25MM (Screw) IMPLANT
SUCTION TUBE FRAZIER 12FR DISP (SUCTIONS) ×1 IMPLANT
SUT ETHIBOND NAB CT1 #1 30IN (SUTURE) ×3 IMPLANT
SUT VIC AB 0 CT1 36 (SUTURE) ×1 IMPLANT
SUT VIC AB 1 CT1 36 (SUTURE) ×3 IMPLANT
SUT VIC AB 2-0 CT1 27 (SUTURE) ×2
SUT VIC AB 2-0 CT1 TAPERPNT 27 (SUTURE) ×2 IMPLANT
SUT VIC AB 3-0 SH 27 (SUTURE) ×2
SUT VIC AB 3-0 SH 27X BRD (SUTURE) ×2 IMPLANT
SYR CONTROL 10ML LL (SYRINGE) ×2 IMPLANT
TAP DUOFIX SZ5 STD OFF (Hips) IMPLANT
TOWEL OR 17X26 10 PK STRL BLUE (TOWEL DISPOSABLE) ×1 IMPLANT
TRAY FOLEY MTR SLVR 16FR STAT (SET/KITS/TRAYS/PACK) ×1 IMPLANT
TUBE SUCTION HIGH CAP CLEAR NV (SUCTIONS) ×1 IMPLANT
WATER STERILE IRR 1000ML POUR (IV SOLUTION) ×2 IMPLANT

## 2023-03-30 NOTE — Op Note (Signed)
03/30/2023  9:33 AM  PATIENT:  Tonya Murphy   MRN: [id number]  PRE-OPERATIVE DIAGNOSIS: Left hip avascular necrosis with collapse of the femoral head  POST-OPERATIVE DIAGNOSIS:  same  PROCEDURE:  Procedure(s): TOTAL HIP ARTHROPLASTY  PREOPERATIVE INDICATIONS:    Tonya Murphy is an 82 y.o. female who has a diagnosis of left hip avascular necrosis and elected for surgical management after failing conservative treatment.  The risks benefits and alternatives were discussed with the patient including but not limited to the risks of nonoperative treatment, versus surgical intervention including infection, bleeding, nerve injury, periprosthetic fracture, the need for revision surgery, dislocation, leg length discrepancy, blood clots, cardiopulmonary complications, morbidity, mortality, among others, and they were willing to proceed.     OPERATIVE REPORT     SURGEON:  Teryl Lucy, MD    ASSISTANT:  Janine Ores, PA-C, (Present throughout the entire procedure,  necessary for completion of procedure in a timely manner, assisting with retraction, instrumentation, and closure)     ANESTHESIA: Spinal  ESTIMATED BLOOD LOSS: 100 mL    COMPLICATIONS:  None.     UNIQUE ASPECTS OF THE CASE: Access to the femoral head and acetabulum was relatively smooth.  I had excellent exposure to the acetabulum, and although she was fairly small, I was able to match her anterior wall fairly well.  She had some posterior and superior beads showing.  I had a good stick, on the cup, although I did place a screw just for augmentation purposes.  COMPONENTS:   Implant Name Type Inv. Item Serial No. Manufacturer Lot No. LRB No. Used Action  SCREW 6.5MMX25MM - [id number] Screw SCREW 6.5MMX25MM  [employer] [id number] Left 1 Implanted  CUP SECTOR GRIPTON - [id number] Cup CUP SECTOR GRIPTON  [employer] [id number] Left 1 Implanted  HEAD FEM STD 32X+1 STRL - [id number] Hips HEAD FEM STD 32X+1  STRL  [employer] [id number] Left 1 Implanted  ELIMINATOR HOLE APEX DEPUY - [id number] Hips ELIMINATOR HOLE APEX DEPUY  [employer]PU[employer] [id number] Left 1 Implanted  TAP DUOFIX SZ5 STD OFF - [id number] Hips TAP DUOFIX SZ5 STD OFF  [employer] [id number] Left 1 Implanted  PINNACLE PLUS 4 NEUTRAL - [id number] Hips PINNACLE PLUS 4 NEUTRAL  [employer] [id number] Left 1 Implanted       PROCEDURE IN DETAIL:   The patient was met in the holding area and  identified.  The appropriate hip was identified and marked at the operative site.  The patient was then transported to the OR  and  placed under anesthesia.  At that point, the patient was  placed in the lateral decubitus position with the operative side up and  secured to the operating room table and all bony prominences padded.     The operative lower extremity was prepped from the iliac crest to the distal leg.  Sterile draping was performed.  Time out was performed prior to incision.      A routine posterolateral approach was utilized via sharp dissection  carried down to the subcutaneous tissue.  Gross bleeders were Bovie coagulated.  The iliotibial band was identified and incised along the length of the skin incision.  Self-retaining retractors were  inserted.  With the hip internally rotated, the short external rotators  were identified. The piriformis and capsule was tagged with FiberWire, and the hip capsule released in a T-type fashion.  The femoral neck was exposed, and I resected the femoral neck using the appropriate jig. This was performed  at approximately a thumb's breadth above the lesser trochanter.    I then exposed the deep acetabulum, cleared out any tissue including the ligamentum teres.  A wing retractor was placed.  After adequate visualization, I excised the labrum, and then sequentially reamed.  I placed the trial acetabulum, which seated nicely, and then impacted the real cup into place.  Appropriate version and  inclination was confirmed clinically matching their bony anatomy, and also with the use of the jig.  I placed a cancellous screw to augment fixation.  A trial polyethylene liner was placed and the wing retractor removed.    I then prepared the proximal femur using the cookie-cutter, the lateralizing reamer, and then sequentially reamed and broached.  A trial broach, neck, and head was utilized, and I reduced the hip and it was found to have excellent stability with functional range of motion. The trial components were then removed, and the real polyethylene liner was placed.  I then impacted the real femoral prosthesis into place into the appropriate version, slightly anteverted to the normal anatomy, and I impacted the real head ball into place. The hip was then reduced and taken through functional range of motion and found to have excellent stability. Leg lengths were restored.  I then used a 2 mm drill bits to pass the FiberWire suture from the capsule and piriformis through the greater trochanter, and secured this. Excellent posterior capsular repair was achieved. I also closed the T in the capsule.  I then irrigated the hip copiously again with pulse lavage, and repaired the fascia with Vicryl, followed by Vicryl for the subcutaneous tissue, Monocryl for the skin, Steri-Strips and sterile gauze. The wounds were injected. The patient was then awakened and returned to PACU in stable and satisfactory condition. There were no complications.  Teryl Lucy, MD Orthopedic Surgeon [phone]   03/30/2023 9:33 AM

## 2023-03-30 NOTE — Anesthesia Postprocedure Evaluation (Signed)
Anesthesia Post Note  Patient: Tonya Murphy  Procedure(s) Performed: TOTAL HIP ARTHROPLASTY (Left: Hip)     Patient location during evaluation: PACU Anesthesia Type: MAC and Spinal Level of consciousness: awake and alert Pain management: pain level controlled Vital Signs Assessment: post-procedure vital signs reviewed and stable Respiratory status: spontaneous breathing, nonlabored ventilation, respiratory function stable and patient connected to nasal cannula oxygen Cardiovascular status: stable and blood pressure returned to baseline Postop Assessment: no apparent nausea or vomiting Anesthetic complications: no   No notable events documented.  Last Vitals:  Vitals:   03/30/23 1200 03/30/23 1330  BP: 138/79 130/80  Pulse: 70 72  Resp: 20 18  Temp:  37 C  SpO2: 94% 94%    Last Pain:  Vitals:   03/30/23 1330  TempSrc:   PainSc: 5                  Edom Schmuhl P Mahin Guardia

## 2023-03-30 NOTE — Transfer of Care (Signed)
Immediate Anesthesia Transfer of Care Note  Patient: Tonya Murphy  Procedure(s) Performed: TOTAL HIP ARTHROPLASTY (Left: Hip)  Patient Location: PACU  Anesthesia Type:Spinal  Level of Consciousness: awake, alert , oriented, and patient cooperative  Airway & Oxygen Therapy: Patient Spontanous Breathing and Patient connected to face mask oxygen  Post-op Assessment: Report given to RN and Post -op Vital signs reviewed and stable  Post vital signs: Reviewed and stable  Last Vitals:  Vitals Value Taken Time  BP 114/69 03/30/23 0941  Temp    Pulse 62 03/30/23 0945  Resp 18 03/30/23 0945  SpO2 99 % 03/30/23 0945  Vitals shown include unvalidated device data.  Last Pain:  Vitals:   03/30/23 0636  TempSrc:   PainSc: 0-No pain         Complications: No notable events documented.

## 2023-03-30 NOTE — Discharge Instructions (Signed)
INSTRUCTIONS AFTER JOINT REPLACEMENT  ° °Remove items at home which could result in a fall. This includes throw rugs or furniture in walking pathways °ICE to the affected joint every three hours while awake for 30 minutes at a time, for at least the first 3-5 days, and then as needed for pain and swelling.  Continue to use ice for pain and swelling. You may notice swelling that will progress down to the foot and ankle.  This is normal after surgery.  Elevate your leg when you are not up walking on it.   °Continue to use the breathing machine you got in the hospital (incentive spirometer) which will help keep your temperature down.  It is common for your temperature to cycle up and down following surgery, especially at night when you are not up moving around and exerting yourself.  The breathing machine keeps your lungs expanded and your temperature down. ° ° °DIET:  As you were doing prior to hospitalization, we recommend a well-balanced diet. ° °DRESSING / WOUND CARE / SHOWERING ° °You may shower 3 days after surgery, but keep the wounds dry during showering.  You may use an occlusive plastic wrap (Press'n Seal for example), NO SOAKING/SUBMERGING IN THE BATHTUB.  If the bandage gets wet, change with a clean dry gauze.  If the incision gets wet, pat the wound dry with a clean towel. ° °ACTIVITY ° °Increase activity slowly as tolerated, but follow the weight bearing instructions below.   °No driving for 6 weeks or until further direction given by your physician.  You cannot drive while taking narcotics.  °No lifting or carrying greater than 10 lbs. until further directed by your surgeon. °Avoid periods of inactivity such as sitting longer than an hour when not asleep. This helps prevent blood clots.  °You may return to work once you are authorized by your doctor.  ° ° ° °WEIGHT BEARING  ° °Weight bearing as tolerated with assist device (walker, cane, etc) as directed, use it as long as suggested by your surgeon or  therapist, typically at least 4-6 weeks. ° ° °EXERCISES ° °Results after joint replacement surgery are often greatly improved when you follow the exercise, range of motion and muscle strengthening exercises prescribed by your doctor. Safety measures are also important to protect the joint from further injury. Any time any of these exercises cause you to have increased pain or swelling, decrease what you are doing until you are comfortable again and then slowly increase them. If you have problems or questions, call your caregiver or physical therapist for advice.  ° °Rehabilitation is important following a joint replacement. After just a few days of immobilization, the muscles of the leg can become weakened and shrink (atrophy).  These exercises are designed to build up the tone and strength of the thigh and leg muscles and to improve motion. Often times heat used for twenty to thirty minutes before working out will loosen up your tissues and help with improving the range of motion but do not use heat for the first two weeks following surgery (sometimes heat can increase post-operative swelling).  ° °These exercises can be done on a training (exercise) mat, on the floor, on a table or on a bed. Use whatever works the best and is most comfortable for you.    Use music or television while you are exercising so that the exercises are a pleasant break in your day. This will make your life better with the exercises acting   as a break in your routine that you can look forward to.   Perform all exercises about fifteen times, three times per day or as directed.  You should exercise both the operative leg and the other leg as well. ° °Exercises include: °  °Quad Sets - Tighten up the muscle on the front of the thigh (Quad) and hold for 5-10 seconds.   °Straight Leg Raises - With your knee straight (if you were given a brace, keep it on), lift the leg to 60 degrees, hold for 3 seconds, and slowly lower the leg.  Perform this  exercise against resistance later as your leg gets stronger.  °Leg Slides: Lying on your back, slowly slide your foot toward your buttocks, bending your knee up off the floor (only go as far as is comfortable). Then slowly slide your foot back down until your leg is flat on the floor again.  °Angel Wings: Lying on your back spread your legs to the side as far apart as you can without causing discomfort.  °Hamstring Strength:  Lying on your back, push your heel against the floor with your leg straight by tightening up the muscles of your buttocks.  Repeat, but this time bend your knee to a comfortable angle, and push your heel against the floor.  You may put a pillow under the heel to make it more comfortable if necessary.  ° °A rehabilitation program following joint replacement surgery can speed recovery and prevent re-injury in the future due to weakened muscles. Contact your doctor or a physical therapist for more information on knee rehabilitation.  ° ° °CONSTIPATION ° °Constipation is defined medically as fewer than three stools per week and severe constipation as less than one stool per week.  Even if you have a regular bowel pattern at home, your normal regimen is likely to be disrupted due to multiple reasons following surgery.  Combination of anesthesia, postoperative narcotics, change in appetite and fluid intake all can affect your bowels.  ° °YOU MUST use at least one of the following options; they are listed in order of increasing strength to get the job done.  They are all available over the counter, and you may need to use some, POSSIBLY even all of these options:   ° °Drink plenty of fluids (prune juice may be helpful) and high fiber foods °Colace 100 mg by mouth twice a day  °Senokot for constipation as directed and as needed Dulcolax (bisacodyl), take with full glass of water  °Miralax (polyethylene glycol) once or twice a day as needed. ° °If you have tried all these things and are unable to have a  bowel movement in the first 3-4 days after surgery call either your surgeon or your primary doctor.   ° °If you experience loose stools or diarrhea, hold the medications until you stool forms back up.  If your symptoms do not get better within 1 week or if they get worse, check with your doctor.  If you experience "the worst abdominal pain ever" or develop nausea or vomiting, please contact the office immediately for further recommendations for treatment. ° ° °ITCHING:  If you experience itching with your medications, try taking only a single pain pill, or even half a pain pill at a time.  You can also use Benadryl over the counter for itching or also to help with sleep.  ° °TED HOSE STOCKINGS:  Use stockings on both legs until for at least 2 weeks or as directed by   physician office. They may be removed at night for sleeping. ° °MEDICATIONS:  See your medication summary on the “After Visit Summary” that nursing will review with you.  You may have some home medications which will be placed on hold until you complete the course of blood thinner medication.  It is important for you to complete the blood thinner medication as prescribed. ° °PRECAUTIONS:  If you experience chest pain or shortness of breath - call 911 immediately for transfer to the hospital emergency department.  ° °If you develop a fever greater that 101 F, purulent drainage from wound, increased redness or drainage from wound, foul odor from the wound/dressing, or calf pain - CONTACT YOUR SURGEON.   °                                                °FOLLOW-UP APPOINTMENTS:  If you do not already have a post-op appointment, please call the office for an appointment to be seen by your surgeon.  Guidelines for how soon to be seen are listed in your “After Visit Summary”, but are typically between 1-4 weeks after surgery. ° °OTHER INSTRUCTIONS:  ° °POST-OPERATIVE OPIOID TAPER INSTRUCTIONS: °It is important to wean off of your opioid medication as soon as  possible. If you do not need pain medication after your surgery it is ok to stop day one. °Opioids include: °Codeine, Hydrocodone(Norco, Vicodin), Oxycodone(Percocet, oxycontin) and hydromorphone amongst others.  °Long term and even short term use of opiods can cause: °Increased pain response °Dependence °Constipation °Depression °Respiratory depression °And more.  °Withdrawal symptoms can include °Flu like symptoms °Nausea, vomiting °And more °Techniques to manage these symptoms °Hydrate well °Eat regular healthy meals °Stay active °Use relaxation techniques(deep breathing, meditating, yoga) °Do Not substitute Alcohol to help with tapering °If you have been on opioids for less than two weeks and do not have pain than it is ok to stop all together.  °Plan to wean off of opioids °This plan should start within one week post op of your joint replacement. °Maintain the same interval or time between taking each dose and first decrease the dose.  °Cut the total daily intake of opioids by one tablet each day °Next start to increase the time between doses. °The last dose that should be eliminated is the evening dose.  ° °MAKE SURE YOU:  °Understand these instructions.  °Get help right away if you are not doing well or get worse.  ° ° °Thank you for letting us be a part of your medical care team.  It is a privilege we respect greatly.  We hope these instructions will help you stay on track for a fast and full recovery!  ° ° °  °

## 2023-03-30 NOTE — Evaluation (Signed)
Physical Therapy Evaluation Patient Details Name: Tonya Murphy MRN: [id number] DOB: Jun 17, 1941 Today's Date: 03/30/2023  History of Present Illness  82 yo female, S/P L THA, PMH:  Left humeral fx, COPD, AAA  Clinical Impression  The patient ambulated 200+ feet, practiced steps and reviewed HEP with patient and daughter. Patient has met PT goals for DC.        Recommendations for follow up therapy are one component of a multi-disciplinary discharge planning process, led by the attending physician.  Recommendations may be updated based on patient status, additional functional criteria and insurance authorization.  Follow Up Recommendations       Assistance Recommended at Discharge Intermittent Supervision/Assistance  Patient can return home with the following  A little help with walking and/or transfers;Help with stairs or ramp for entrance;Assistance with cooking/housework;Assist for transportation    Equipment Recommendations Rolling walker (2 wheels) (youth)  Recommendations for Other Services       Functional Status Assessment Patient has had a recent decline in their functional status and demonstrates the ability to make significant improvements in function in a reasonable and predictable amount of time.     Precautions / Restrictions Precautions Precautions: Fall Restrictions Weight Bearing Restrictions: Yes LLE Weight Bearing: Weight bearing as tolerated      Mobility  Bed Mobility Overal bed mobility: Needs Assistance Bed Mobility: Supine to Sit     Supine to sit: Min assist     General bed mobility comments: asssited with LLE to move to sitting    Transfers Overall transfer level: Needs assistance Equipment used: Rolling walker (2 wheels) Transfers: Sit to/from Stand Sit to Stand: Min assist           General transfer comment: cues for hand placement, from  tretcher, recliner and toilet    Ambulation/Gait Ambulation/Gait assistance: Min  assist, Min guard Gait Distance (Feet): 200 Feet Assistive device: Rolling walker (2 wheels) Gait Pattern/deviations: Step-to pattern, Step-through pattern, Antalgic Gait velocity: decr     General Gait Details: cues for safety and sequence  Stairs Stairs: Yes Stairs assistance: Min assist   Number of Stairs: 2 General stair comments: initially unable to step up with HHA and cane, performed backward with RW, then able to practice forward and cane  while stabilizing can(pt uses quad cane.)  Wheelchair Mobility    Modified Rankin (Stroke Patients Only)       Balance Overall balance assessment: Mild deficits observed, not formally tested                                           Pertinent Vitals/Pain Pain Assessment Pain Assessment: 0-10 Pain Score: 8  Pain Location: left hip Pain Descriptors / Indicators: Aching, Discomfort Pain Intervention(s): Premedicated before session, Ice applied, Monitored during session    Home Living Family/patient expects to be discharged to:: Private residence Living Arrangements: Alone Available Help at Discharge: Family;Available 24 hours/day Type of Home: House Home Access: Stairs to enter Entrance Stairs-Rails: None Entrance Stairs-Number of Steps: 2   Home Layout: One level Home Equipment: Cane - quad Additional Comments: old RW    Prior Function Prior Level of Function : Independent/Modified Independent             Mobility Comments: used cane, did npt drive due to hip pain ADLs Comments: IADLs     Hand Dominance   Dominant Hand: Right  Extremity/Trunk Assessment   Upper Extremity Assessment Upper Extremity Assessment: Overall WFL for tasks assessed;LUE deficits/detail LUE Deficits / Details: limited shoulder flexion    Lower Extremity Assessment Lower Extremity Assessment: LLE deficits/detail LLE Deficits / Details: able to flex hip  in supine to 50.       Communication   Communication:  No difficulties  Cognition Arousal/Alertness: Awake/alert Behavior During Therapy: WFL for tasks assessed/performed Overall Cognitive Status: Within Functional Limits for tasks assessed                                          General Comments      Exercises Total Joint Exercises Ankle Circles/Pumps: AROM, 10 reps Heel Slides: AROM, AAROM, Left, 5 reps, Supine Long Arc Quad: AROM, Left, Both, 10 reps, Seated   Assessment/Plan    PT Assessment All further PT needs can be met in the next venue of care  PT Problem List Decreased strength;Decreased mobility;Decreased safety awareness;Decreased activity tolerance;Pain       PT Treatment Interventions DME instruction;Therapeutic activities;Gait training;Therapeutic exercise;Patient/family education;Stair training;Functional mobility training    PT Goals (Current goals can be found in the Care Plan section)  Acute Rehab PT Goals Patient Stated Goal: go home, no pain PT Goal Formulation: All assessment and education complete, DC therapy    Frequency       Co-evaluation               AM-PAC PT "6 Clicks" Mobility  Outcome Measure Help needed turning from your back to your side while in a flat bed without using bedrails?: A Little Help needed moving from lying on your back to sitting on the side of a flat bed without using bedrails?: A Little Help needed moving to and from a bed to a chair (including a wheelchair)?: A Little Help needed standing up from a chair using your arms (e.g., wheelchair or bedside chair)?: A Little Help needed to walk in hospital room?: A Little Help needed climbing 3-5 steps with a railing? : A Little 6 Click Score: 18    End of Session Equipment Utilized During Treatment: Gait belt Activity Tolerance: Patient tolerated treatment well Patient left: in chair;with family/visitor present;with call bell/phone within reach Nurse Communication: Mobility status PT Visit Diagnosis:  Unsteadiness on feet (R26.81);Pain Pain - Right/Left: Left Pain - part of body: Hip    Time: 6295-2841 PT Time Calculation (min) (ACUTE ONLY): 43 min   Charges:   PT Evaluation $PT Eval Low Complexity: 1 Low PT Treatments $Gait Training: 8-22 mins $Therapeutic Exercise: 8-22 mins        Blanchard Kelch PT Acute Rehabilitation Services Office [phone] Weekend pager-[phone]   Rada Hay 03/30/2023, 2:35 PM

## 2023-03-30 NOTE — Anesthesia Procedure Notes (Signed)
Spinal  Patient location during procedure: OR Start time: 03/30/2023 7:47 AM End time: 03/30/2023 7:49 AM Staffing Performed: anesthesiologist  Anesthesiologist: Atilano Median, DO Performed by: Atilano Median, DO Authorized by: Atilano Median, DO   Preanesthetic Checklist Completed: patient identified, IV checked, site marked, risks and benefits discussed, surgical consent, monitors and equipment checked, pre-op evaluation and timeout performed Spinal Block Patient position: sitting Prep: DuraPrep Patient monitoring: heart rate, cardiac monitor, continuous pulse ox and blood pressure Approach: midline Location: L3-4 Injection technique: single-shot Needle Needle type: Pencan  Needle gauge: 24 G Needle length: 10 cm Assessment Events: CSF return Additional Notes Patient identified. Risks/Benefits/Options discussed with patient including but not limited to bleeding, infection, nerve damage, paralysis, failed block, incomplete pain control, headache, blood pressure changes, nausea, vomiting, reactions to medications, itching and postpartum back pain. Confirmed with bedside nurse the patient's most recent platelet count. Confirmed with patient that they are not currently taking any anticoagulation, have any bleeding history or any family history of bleeding disorders. Patient expressed understanding and wished to proceed. All questions were answered. Sterile technique was used throughout the entire procedure. Please see nursing notes for vital signs. Warning signs of high block given to the patient including shortness of breath, tingling/numbness in hands, complete motor block, or any concerning symptoms with instructions to call for help. Patient was given instructions on fall risk and not to get out of bed. All questions and concerns addressed with instructions to call with any issues or inadequate analgesia.

## 2023-03-30 NOTE — Interval H&P Note (Signed)
History and Physical Interval Note:  03/30/2023 7:18 AM  Tonya Murphy  has presented today for surgery, with the diagnosis of OA LEFT HIP.  The various methods of treatment have been discussed with the patient and family. After consideration of risks, benefits and other options for treatment, the patient has consented to  Procedure(s): TOTAL HIP ARTHROPLASTY (Left) as a surgical intervention.  The patient's history has been reviewed, patient examined, no change in status, stable for surgery.  I have reviewed the patient's chart and labs.  Questions were answered to the patient's satisfaction.     [name]

## 2023-03-31 ENCOUNTER — Encounter (HOSPITAL_COMMUNITY): Payer: Self-pay | Admitting: Orthopedic Surgery

## 2023-04-02 DIAGNOSIS — M25552 Pain in left hip: Secondary | ICD-10-CM | POA: Diagnosis not present

## 2023-04-02 DIAGNOSIS — Z471 Aftercare following joint replacement surgery: Secondary | ICD-10-CM | POA: Diagnosis not present

## 2023-04-02 DIAGNOSIS — M6281 Muscle weakness (generalized): Secondary | ICD-10-CM | POA: Diagnosis not present

## 2023-04-05 DIAGNOSIS — M6281 Muscle weakness (generalized): Secondary | ICD-10-CM | POA: Diagnosis not present

## 2023-04-05 DIAGNOSIS — M25552 Pain in left hip: Secondary | ICD-10-CM | POA: Diagnosis not present

## 2023-04-05 DIAGNOSIS — Z471 Aftercare following joint replacement surgery: Secondary | ICD-10-CM | POA: Diagnosis not present

## 2023-04-07 DIAGNOSIS — M6281 Muscle weakness (generalized): Secondary | ICD-10-CM | POA: Diagnosis not present

## 2023-04-07 DIAGNOSIS — Z471 Aftercare following joint replacement surgery: Secondary | ICD-10-CM | POA: Diagnosis not present

## 2023-04-07 DIAGNOSIS — M25552 Pain in left hip: Secondary | ICD-10-CM | POA: Diagnosis not present

## 2023-04-12 DIAGNOSIS — M25552 Pain in left hip: Secondary | ICD-10-CM | POA: Diagnosis not present

## 2023-04-12 DIAGNOSIS — M6281 Muscle weakness (generalized): Secondary | ICD-10-CM | POA: Diagnosis not present

## 2023-04-12 DIAGNOSIS — Z471 Aftercare following joint replacement surgery: Secondary | ICD-10-CM | POA: Diagnosis not present

## 2023-04-14 DIAGNOSIS — M1612 Unilateral primary osteoarthritis, left hip: Secondary | ICD-10-CM | POA: Diagnosis not present

## 2023-04-28 DIAGNOSIS — S42292D Other displaced fracture of upper end of left humerus, subsequent encounter for fracture with routine healing: Secondary | ICD-10-CM | POA: Diagnosis not present

## 2023-05-26 DIAGNOSIS — S42292D Other displaced fracture of upper end of left humerus, subsequent encounter for fracture with routine healing: Secondary | ICD-10-CM | POA: Diagnosis not present

## 2023-06-11 DIAGNOSIS — D692 Other nonthrombocytopenic purpura: Secondary | ICD-10-CM | POA: Diagnosis not present

## 2023-06-11 DIAGNOSIS — I10 Essential (primary) hypertension: Secondary | ICD-10-CM | POA: Diagnosis not present

## 2023-06-11 DIAGNOSIS — M659 Synovitis and tenosynovitis, unspecified: Secondary | ICD-10-CM | POA: Diagnosis not present

## 2023-06-11 DIAGNOSIS — Z299 Encounter for prophylactic measures, unspecified: Secondary | ICD-10-CM | POA: Diagnosis not present

## 2023-06-11 DIAGNOSIS — M25562 Pain in left knee: Secondary | ICD-10-CM | POA: Diagnosis not present

## 2023-07-14 DIAGNOSIS — M1612 Unilateral primary osteoarthritis, left hip: Secondary | ICD-10-CM | POA: Diagnosis not present

## 2023-09-07 DIAGNOSIS — E039 Hypothyroidism, unspecified: Secondary | ICD-10-CM | POA: Diagnosis not present

## 2023-09-07 DIAGNOSIS — I10 Essential (primary) hypertension: Secondary | ICD-10-CM | POA: Diagnosis not present

## 2023-09-07 DIAGNOSIS — Z0000 Encounter for general adult medical examination without abnormal findings: Secondary | ICD-10-CM | POA: Diagnosis not present

## 2023-09-07 DIAGNOSIS — F17210 Nicotine dependence, cigarettes, uncomplicated: Secondary | ICD-10-CM | POA: Diagnosis not present

## 2023-09-07 DIAGNOSIS — E7800 Pure hypercholesterolemia, unspecified: Secondary | ICD-10-CM | POA: Diagnosis not present

## 2023-09-07 DIAGNOSIS — Z299 Encounter for prophylactic measures, unspecified: Secondary | ICD-10-CM | POA: Diagnosis not present

## 2023-09-07 DIAGNOSIS — R5383 Other fatigue: Secondary | ICD-10-CM | POA: Diagnosis not present

## 2023-09-08 DIAGNOSIS — Z79899 Other long term (current) drug therapy: Secondary | ICD-10-CM | POA: Diagnosis not present

## 2023-09-08 DIAGNOSIS — M810 Age-related osteoporosis without current pathological fracture: Secondary | ICD-10-CM | POA: Diagnosis not present

## 2023-09-08 DIAGNOSIS — R5383 Other fatigue: Secondary | ICD-10-CM | POA: Diagnosis not present

## 2023-09-08 DIAGNOSIS — E7800 Pure hypercholesterolemia, unspecified: Secondary | ICD-10-CM | POA: Diagnosis not present

## 2023-09-08 DIAGNOSIS — E039 Hypothyroidism, unspecified: Secondary | ICD-10-CM | POA: Diagnosis not present

## 2023-09-15 DIAGNOSIS — F17210 Nicotine dependence, cigarettes, uncomplicated: Secondary | ICD-10-CM | POA: Diagnosis not present

## 2023-09-15 DIAGNOSIS — M25511 Pain in right shoulder: Secondary | ICD-10-CM | POA: Diagnosis not present

## 2023-10-05 DIAGNOSIS — Z8781 Personal history of (healed) traumatic fracture: Secondary | ICD-10-CM | POA: Diagnosis not present

## 2023-10-05 DIAGNOSIS — M4186 Other forms of scoliosis, lumbar region: Secondary | ICD-10-CM | POA: Diagnosis not present

## 2023-10-05 DIAGNOSIS — M47816 Spondylosis without myelopathy or radiculopathy, lumbar region: Secondary | ICD-10-CM | POA: Diagnosis not present

## 2023-10-05 DIAGNOSIS — M47896 Other spondylosis, lumbar region: Secondary | ICD-10-CM | POA: Diagnosis not present

## 2023-10-05 DIAGNOSIS — M4187 Other forms of scoliosis, lumbosacral region: Secondary | ICD-10-CM | POA: Diagnosis not present

## 2023-10-05 DIAGNOSIS — M549 Dorsalgia, unspecified: Secondary | ICD-10-CM | POA: Diagnosis not present

## 2023-10-05 DIAGNOSIS — I7143 Infrarenal abdominal aortic aneurysm, without rupture: Secondary | ICD-10-CM | POA: Diagnosis not present

## 2023-10-05 DIAGNOSIS — I700 Atherosclerosis of aorta: Secondary | ICD-10-CM | POA: Diagnosis not present

## 2023-10-05 DIAGNOSIS — Z299 Encounter for prophylactic measures, unspecified: Secondary | ICD-10-CM | POA: Diagnosis not present

## 2023-10-05 DIAGNOSIS — M4714 Other spondylosis with myelopathy, thoracic region: Secondary | ICD-10-CM | POA: Diagnosis not present

## 2023-10-05 DIAGNOSIS — I739 Peripheral vascular disease, unspecified: Secondary | ICD-10-CM | POA: Diagnosis not present

## 2023-10-05 DIAGNOSIS — I714 Abdominal aortic aneurysm, without rupture, unspecified: Secondary | ICD-10-CM | POA: Diagnosis not present

## 2023-10-05 DIAGNOSIS — R918 Other nonspecific abnormal finding of lung field: Secondary | ICD-10-CM | POA: Diagnosis not present

## 2023-10-05 DIAGNOSIS — J441 Chronic obstructive pulmonary disease with (acute) exacerbation: Secondary | ICD-10-CM | POA: Diagnosis not present

## 2023-10-05 DIAGNOSIS — M47814 Spondylosis without myelopathy or radiculopathy, thoracic region: Secondary | ICD-10-CM | POA: Diagnosis not present

## 2023-10-05 DIAGNOSIS — R0600 Dyspnea, unspecified: Secondary | ICD-10-CM | POA: Diagnosis not present

## 2023-10-05 DIAGNOSIS — R5383 Other fatigue: Secondary | ICD-10-CM | POA: Diagnosis not present

## 2023-10-28 DIAGNOSIS — Z9889 Other specified postprocedural states: Secondary | ICD-10-CM | POA: Diagnosis not present

## 2023-10-28 DIAGNOSIS — M8008XA Age-related osteoporosis with current pathological fracture, vertebra(e), initial encounter for fracture: Secondary | ICD-10-CM | POA: Diagnosis not present

## 2023-10-28 DIAGNOSIS — S22040A Wedge compression fracture of fourth thoracic vertebra, initial encounter for closed fracture: Secondary | ICD-10-CM | POA: Diagnosis not present

## 2023-12-29 DIAGNOSIS — J441 Chronic obstructive pulmonary disease with (acute) exacerbation: Secondary | ICD-10-CM | POA: Diagnosis not present

## 2023-12-29 DIAGNOSIS — I714 Abdominal aortic aneurysm, without rupture, unspecified: Secondary | ICD-10-CM | POA: Diagnosis not present

## 2023-12-29 DIAGNOSIS — I10 Essential (primary) hypertension: Secondary | ICD-10-CM | POA: Diagnosis not present

## 2023-12-29 DIAGNOSIS — F17210 Nicotine dependence, cigarettes, uncomplicated: Secondary | ICD-10-CM | POA: Diagnosis not present

## 2023-12-29 DIAGNOSIS — Z299 Encounter for prophylactic measures, unspecified: Secondary | ICD-10-CM | POA: Diagnosis not present

## 2024-01-19 DIAGNOSIS — D692 Other nonthrombocytopenic purpura: Secondary | ICD-10-CM | POA: Diagnosis not present

## 2024-01-19 DIAGNOSIS — J441 Chronic obstructive pulmonary disease with (acute) exacerbation: Secondary | ICD-10-CM | POA: Diagnosis not present

## 2024-01-19 DIAGNOSIS — I739 Peripheral vascular disease, unspecified: Secondary | ICD-10-CM | POA: Diagnosis not present

## 2024-01-19 DIAGNOSIS — F17210 Nicotine dependence, cigarettes, uncomplicated: Secondary | ICD-10-CM | POA: Diagnosis not present

## 2024-01-19 DIAGNOSIS — I10 Essential (primary) hypertension: Secondary | ICD-10-CM | POA: Diagnosis not present

## 2024-01-19 DIAGNOSIS — Z299 Encounter for prophylactic measures, unspecified: Secondary | ICD-10-CM | POA: Diagnosis not present

## 2024-03-15 DIAGNOSIS — I7143 Infrarenal abdominal aortic aneurysm, without rupture: Secondary | ICD-10-CM | POA: Diagnosis not present

## 2024-03-15 DIAGNOSIS — I714 Abdominal aortic aneurysm, without rupture, unspecified: Secondary | ICD-10-CM | POA: Diagnosis not present

## 2024-03-16 DIAGNOSIS — K9049 Malabsorption due to intolerance, not elsewhere classified: Secondary | ICD-10-CM | POA: Diagnosis not present

## 2024-03-16 DIAGNOSIS — Z299 Encounter for prophylactic measures, unspecified: Secondary | ICD-10-CM | POA: Diagnosis not present

## 2024-03-16 DIAGNOSIS — I714 Abdominal aortic aneurysm, without rupture, unspecified: Secondary | ICD-10-CM | POA: Diagnosis not present

## 2024-03-16 DIAGNOSIS — I10 Essential (primary) hypertension: Secondary | ICD-10-CM | POA: Diagnosis not present

## 2024-03-22 DIAGNOSIS — C44629 Squamous cell carcinoma of skin of left upper limb, including shoulder: Secondary | ICD-10-CM | POA: Diagnosis not present

## 2024-03-22 DIAGNOSIS — L820 Inflamed seborrheic keratosis: Secondary | ICD-10-CM | POA: Diagnosis not present

## 2024-03-22 DIAGNOSIS — T148XXA Other injury of unspecified body region, initial encounter: Secondary | ICD-10-CM | POA: Diagnosis not present

## 2024-04-11 DIAGNOSIS — F17210 Nicotine dependence, cigarettes, uncomplicated: Secondary | ICD-10-CM | POA: Diagnosis not present

## 2024-04-11 DIAGNOSIS — Z299 Encounter for prophylactic measures, unspecified: Secondary | ICD-10-CM | POA: Diagnosis not present

## 2024-04-11 DIAGNOSIS — I10 Essential (primary) hypertension: Secondary | ICD-10-CM | POA: Diagnosis not present

## 2024-04-11 DIAGNOSIS — J449 Chronic obstructive pulmonary disease, unspecified: Secondary | ICD-10-CM | POA: Diagnosis not present

## 2024-07-24 DIAGNOSIS — G629 Polyneuropathy, unspecified: Secondary | ICD-10-CM | POA: Diagnosis not present

## 2024-07-24 DIAGNOSIS — J449 Chronic obstructive pulmonary disease, unspecified: Secondary | ICD-10-CM | POA: Diagnosis not present

## 2024-07-24 DIAGNOSIS — I10 Essential (primary) hypertension: Secondary | ICD-10-CM | POA: Diagnosis not present

## 2024-07-24 DIAGNOSIS — Z299 Encounter for prophylactic measures, unspecified: Secondary | ICD-10-CM | POA: Diagnosis not present

## 2024-07-24 DIAGNOSIS — R52 Pain, unspecified: Secondary | ICD-10-CM | POA: Diagnosis not present

## 2024-07-24 DIAGNOSIS — M2550 Pain in unspecified joint: Secondary | ICD-10-CM | POA: Diagnosis not present

## 2024-07-24 DIAGNOSIS — E538 Deficiency of other specified B group vitamins: Secondary | ICD-10-CM | POA: Diagnosis not present

## 2024-07-26 DIAGNOSIS — C44622 Squamous cell carcinoma of skin of right upper limb, including shoulder: Secondary | ICD-10-CM | POA: Diagnosis not present

## 2024-07-26 DIAGNOSIS — C44722 Squamous cell carcinoma of skin of right lower limb, including hip: Secondary | ICD-10-CM | POA: Diagnosis not present

## 2024-09-05 DIAGNOSIS — L720 Epidermal cyst: Secondary | ICD-10-CM | POA: Diagnosis not present

## 2024-09-05 DIAGNOSIS — Z85828 Personal history of other malignant neoplasm of skin: Secondary | ICD-10-CM | POA: Diagnosis not present

## 2024-09-05 DIAGNOSIS — Z08 Encounter for follow-up examination after completed treatment for malignant neoplasm: Secondary | ICD-10-CM | POA: Diagnosis not present

## 2024-09-06 DIAGNOSIS — E7800 Pure hypercholesterolemia, unspecified: Secondary | ICD-10-CM | POA: Diagnosis not present

## 2024-09-06 DIAGNOSIS — Z7189 Other specified counseling: Secondary | ICD-10-CM | POA: Diagnosis not present

## 2024-09-06 DIAGNOSIS — Z0000 Encounter for general adult medical examination without abnormal findings: Secondary | ICD-10-CM | POA: Diagnosis not present

## 2024-09-06 DIAGNOSIS — Z1331 Encounter for screening for depression: Secondary | ICD-10-CM | POA: Diagnosis not present

## 2024-09-06 DIAGNOSIS — Z299 Encounter for prophylactic measures, unspecified: Secondary | ICD-10-CM | POA: Diagnosis not present

## 2024-09-06 DIAGNOSIS — Z1339 Encounter for screening examination for other mental health and behavioral disorders: Secondary | ICD-10-CM | POA: Diagnosis not present

## 2024-09-06 DIAGNOSIS — R5383 Other fatigue: Secondary | ICD-10-CM | POA: Diagnosis not present

## 2024-09-06 DIAGNOSIS — F17210 Nicotine dependence, cigarettes, uncomplicated: Secondary | ICD-10-CM | POA: Diagnosis not present

## 2024-09-06 DIAGNOSIS — I10 Essential (primary) hypertension: Secondary | ICD-10-CM | POA: Diagnosis not present

## 2024-09-06 DIAGNOSIS — F419 Anxiety disorder, unspecified: Secondary | ICD-10-CM | POA: Diagnosis not present

## 2024-09-07 DIAGNOSIS — R5383 Other fatigue: Secondary | ICD-10-CM | POA: Diagnosis not present

## 2024-09-07 DIAGNOSIS — Z79899 Other long term (current) drug therapy: Secondary | ICD-10-CM | POA: Diagnosis not present

## 2024-09-07 DIAGNOSIS — E538 Deficiency of other specified B group vitamins: Secondary | ICD-10-CM | POA: Diagnosis not present

## 2024-09-07 DIAGNOSIS — F419 Anxiety disorder, unspecified: Secondary | ICD-10-CM | POA: Diagnosis not present

## 2024-09-07 DIAGNOSIS — M810 Age-related osteoporosis without current pathological fracture: Secondary | ICD-10-CM | POA: Diagnosis not present

## 2024-09-14 DIAGNOSIS — R7689 Other specified abnormal immunological findings in serum: Secondary | ICD-10-CM | POA: Diagnosis not present

## 2024-09-14 DIAGNOSIS — E663 Overweight: Secondary | ICD-10-CM | POA: Diagnosis not present

## 2024-09-14 DIAGNOSIS — M2560 Stiffness of unspecified joint, not elsewhere classified: Secondary | ICD-10-CM | POA: Diagnosis not present

## 2024-09-14 DIAGNOSIS — Z6826 Body mass index (BMI) 26.0-26.9, adult: Secondary | ICD-10-CM | POA: Diagnosis not present

## 2024-09-14 DIAGNOSIS — M79642 Pain in left hand: Secondary | ICD-10-CM | POA: Diagnosis not present

## 2024-09-14 DIAGNOSIS — R700 Elevated erythrocyte sedimentation rate: Secondary | ICD-10-CM | POA: Diagnosis not present

## 2024-09-14 DIAGNOSIS — M2540 Effusion, unspecified joint: Secondary | ICD-10-CM | POA: Diagnosis not present

## 2024-09-14 DIAGNOSIS — M79641 Pain in right hand: Secondary | ICD-10-CM | POA: Diagnosis not present
